# Patient Record
Sex: Male | Born: 1985 | Race: White | Hispanic: No | Marital: Married | Smoking: Former smoker
Health system: Southern US, Community
[De-identification: ages and names within clinical notes are randomized; demographics above are authoritative.]

---
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
## 2013-12-01 ENCOUNTER — Encounter (HOSPITAL_COMMUNITY): Payer: Self-pay | Admitting: Emergency Medicine

## 2013-12-01 ENCOUNTER — Emergency Department (HOSPITAL_COMMUNITY)
Admission: EM | Admit: 2013-12-01 | Discharge: 2013-12-01 | Disposition: A | Discharge status: Home / Self Care | Payer: BC Managed Care – PPO | Attending: Emergency Medicine | Admitting: Emergency Medicine

## 2013-12-01 ENCOUNTER — Emergency Department (HOSPITAL_COMMUNITY): Payer: BC Managed Care – PPO

## 2013-12-01 DIAGNOSIS — R0789 Other chest pain: Secondary | ICD-10-CM | POA: Insufficient documentation

## 2013-12-01 DIAGNOSIS — F17200 Nicotine dependence, unspecified, uncomplicated: Secondary | ICD-10-CM | POA: Insufficient documentation

## 2013-12-01 LAB — CBC WITH DIFFERENTIAL/PLATELET
Basophils Absolute: 0 10*3/uL (ref 0.0–0.1)
Basophils Relative: 0 % (ref 0–1)
Eosinophils Absolute: 0.1 10*3/uL (ref 0.0–0.7)
Eosinophils Relative: 1 % (ref 0–5)
HCT: 44.8 % (ref 39.0–52.0)
Hemoglobin: 15.6 g/dL (ref 13.0–17.0)
LYMPHS ABS: 1.7 10*3/uL (ref 0.7–4.0)
Lymphocytes Relative: 33 % (ref 12–46)
MCH: 29 pg (ref 26.0–34.0)
MCHC: 34.8 g/dL (ref 30.0–36.0)
MCV: 83.3 fL (ref 78.0–100.0)
MONO ABS: 0.5 10*3/uL (ref 0.1–1.0)
MONOS PCT: 9 % (ref 3–12)
NEUTROS PCT: 57 % (ref 43–77)
Neutro Abs: 2.9 10*3/uL (ref 1.7–7.7)
Platelets: 213 10*3/uL (ref 150–400)
RBC: 5.38 MIL/uL (ref 4.22–5.81)
RDW: 12.6 % (ref 11.5–15.5)
WBC: 5.2 10*3/uL (ref 4.0–10.5)

## 2013-12-01 LAB — COMPREHENSIVE METABOLIC PANEL
ALK PHOS: 74 U/L (ref 39–117)
ALT: 20 U/L (ref 0–53)
AST: 18 U/L (ref 0–37)
Albumin: 4.4 g/dL (ref 3.5–5.2)
BILIRUBIN TOTAL: 0.6 mg/dL (ref 0.3–1.2)
BUN: 15 mg/dL (ref 6–23)
CO2: 28 meq/L (ref 19–32)
Calcium: 10 mg/dL (ref 8.4–10.5)
Chloride: 100 mEq/L (ref 96–112)
Creatinine, Ser: 0.99 mg/dL (ref 0.50–1.35)
GFR calc Af Amer: >90 mL/min (ref >90)
GLUCOSE: 103 mg/dL — AB (ref 70–99)
POTASSIUM: 4.5 meq/L (ref 3.7–5.3)
Sodium: 139 mEq/L (ref 137–147)
Total Protein: 7.5 g/dL (ref 6.0–8.3)

## 2013-12-01 LAB — TROPONIN I: Troponin I: <0.3 ng/mL (ref <0.30)

## 2013-12-01 MED ORDER — OMEPRAZOLE 20 MG PO CPDR: 20.0000 mg | DELAYED_RELEASE_CAPSULE | Freq: Every day | ORAL | Status: DC

## 2013-12-01 NOTE — Discharge Instructions (Signed)
Chest Pain (Nonspecific) °There is no evidence of heart attack or blood clot in the lung. Followup with your doctor. Return to the ED if you develop new or worsening symptoms. °It is often hard to give a specific diagnosis for the cause of chest pain. There is always a chance that your pain could be related to something serious, such as a heart attack or a blood clot in the lungs. You need to follow up with your caregiver for further evaluation. °CAUSES  °· Heartburn. °· Pneumonia or bronchitis. °· Anxiety or stress. °· Inflammation around your heart (pericarditis) or lung (pleuritis or pleurisy). °· A blood clot in the lung. °· A collapsed lung (pneumothorax). It can develop suddenly on its own (spontaneous pneumothorax) or from injury (trauma) to the chest. °· Shingles infection (herpes zoster virus). °The chest wall is composed of bones, muscles, and cartilage. Any of these can be the source of the pain. °· The bones can be bruised by injury. °· The muscles or cartilage can be strained by coughing or overwork. °· The cartilage can be affected by inflammation and become sore (costochondritis). °DIAGNOSIS  °Lab tests or other studies, such as X-rays, electrocardiography, stress testing, or cardiac imaging, may be needed to find the cause of your pain.  °TREATMENT  °· Treatment depends on what may be causing your chest pain. Treatment may include: °· Acid blockers for heartburn. °· Anti-inflammatory medicine. °· Pain medicine for inflammatory conditions. °· Antibiotics if an infection is present. °· You may be advised to change lifestyle habits. This includes stopping smoking and avoiding alcohol, caffeine, and chocolate. °· You may be advised to keep your head raised (elevated) when sleeping. This reduces the chance of acid going backward from your stomach into your esophagus. °· Most of the time, nonspecific chest pain will improve within 2 to 3 days with rest and mild pain medicine. °HOME CARE INSTRUCTIONS  °· If  antibiotics were prescribed, take your antibiotics as directed. Finish them even if you start to feel better. °· For the next few days, avoid physical activities that bring on chest pain. Continue physical activities as directed. °· Do not smoke. °· Avoid drinking alcohol. °· Only take over-the-counter or prescription medicine for pain, discomfort, or fever as directed by your caregiver. °· Follow your caregiver's suggestions for further testing if your chest pain does not go away. °· Keep any follow-up appointments you made. If you do not go to an appointment, you could develop lasting (chronic) problems with pain. If there is any problem keeping an appointment, you must call to reschedule. °SEEK MEDICAL CARE IF:  °· You think you are having problems from the medicine you are taking. Read your medicine instructions carefully. °· Your chest pain does not go away, even after treatment. °· You develop a rash with blisters on your chest. °SEEK IMMEDIATE MEDICAL CARE IF:  °· You have increased chest pain or pain that spreads to your arm, neck, jaw, back, or abdomen. °· You develop shortness of breath, an increasing cough, or you are coughing up blood. °· You have severe back or abdominal pain, feel nauseous, or vomit. °· You develop severe weakness, fainting, or chills. °· You have a fever. °THIS IS AN EMERGENCY. Do not wait to see if the pain will go away. Get medical help at once. Call your local emergency services (911 in [place] Do not drive yourself to the hospital. °MAKE SURE YOU:  °· Understand these instructions. °· Will watch your condition. °·   Will get help right away if you are not doing well or get worse. °Document Released: 05/08/2005 Document Revised: 10/21/2011 Document Reviewed: 03/03/2008 °ExitCare® Patient Information ©2014 [employer]. ° °

## 2013-12-01 NOTE — ED Notes (Signed)
Patient with no complaints at this time. Respirations even and unlabored. Skin warm/dry. Discharge instructions reviewed with patient at this time. Patient given opportunity to voice concerns/ask questions. Patient discharged at this time and left Emergency Department with steady gait.   

## 2013-12-01 NOTE — ED Notes (Signed)
Pt reports sharp chest pain in center of chest that radiates into left side of head.  Reports started last night.  Pt says was able to sleep through the night but pain still there when woke up.  Reports has had similar pain in the past but pain usually goes away on its own.  Reports pain started yesterday after playing golf.  Denies movement or deep breaths making it worse.  Denies n/v or SOB.

## 2013-12-01 NOTE — ED Provider Notes (Signed)
CSN: [id number]     Arrival date & time 12/01/13  1558 History  This chart was scribed for Glynn OctaveStephen Toron Bowring, MD by Bennett Scrapehristina Taylor, ED Scribe. This patient was seen in room APA01/AP[facility]01 and the patient's care was started at 4:20 PM.    Chief Complaint  Patient presents with  . Chest Pain    The history is provided by the patient. No language interpreter was used.   HPI Comments: Andrew Carroll is a 28 y.o. male who presents to the Emergency Department complaining of  intermittent chest pain episodes that began yesterday at 5 PM with the longest episode lasting for 10 seconds at a time.  He states that pain raidates to his head but doesn't last more that 2 or 3 seconds. He states that he has similar episodes of this pain and the severity would be about 5 out of 10 resolving after a 30 minute time frame. He states that yesterday the pain jumped to 8 out of 10 and the continuation of the pain today caused him enough concern to seek evaluation. He denies having pain currently. The pt states that nothing makes the pain better or worse. The pt denies associated symptoms of SOB, nausea, back pain, and neck pain. He denies any h/o of cardiac problems or respiratory issues. Pt also smokes cigarettes regularly. Denies any drug use.   [facility] are [facility]CP   History reviewed. No pertinent past medical history. History reviewed. No pertinent past surgical history. No family history on file. History  Substance Use Topics  . Smoking status: Current Every Day Smoker  . Smokeless tobacco: Not on file  . Alcohol Use: Yes     Comment: occ    Review of Systems  A complete 10 system review of systems was obtained and all systems are negative except as noted in the HPI and PMH.    Allergies  Review of patient's allergies indicates no known allergies.  Home Medications   Prior to Admission medications   Not on File   Triage vitals: BP 114/64  Pulse 77  Temp(Src) 97.4 F (36.3 C) (Oral)   Resp 18  Ht 5\' 7"  (1.702 m)  Wt 190 lb (86.183 kg)  BMI 29.75 kg/m2  SpO2 97% Physical Exam  Nursing note and vitals reviewed. Constitutional: He is oriented to person, place, and time. He appears well-developed and well-nourished. No distress.  HENT:  Head: Normocephalic and atraumatic.  Eyes: EOM are normal.  Neck: Normal range of motion. Neck supple. No tracheal deviation present.  Cardiovascular: Normal rate, regular rhythm and normal heart sounds.   Pulmonary/Chest: Effort normal and breath sounds normal. No respiratory distress. He exhibits no tenderness.  Abdominal: Soft. There is no tenderness. There is no rebound and no guarding.  Musculoskeletal: Normal range of motion.  Intact peripheral pulses, no peripheral edema   Neurological: He is alert and oriented to person, place, and time.  CN 2-12 intact, no ataxia on finger to nose, no nystagmus, 5/5 strength throughout, no pronator drift, Romberg negative, normal gait.   Skin: Skin is warm and dry.  Psychiatric: He has a normal mood and affect. His behavior is normal.    ED Course  Procedures (including critical care time)  DIAGNOSTIC STUDIES: Oxygen Saturation is  97% on RA, Adequate by my interpretation.    COORDINATION OF CARE: 4:49 PM-Discussed treatment plan which includes  CBC panel, CMP,with pt at bedside and pt agreed to plan.   5:17 PM-Pt rechecked and reports having  one episode of pain while ambulating to radiology. Denies pain now. Informed pt of radiology and lab work results that are NOT concerning for cardiac conditions. Discussed discharge plan which includes prilosec with pt and pt agreed to plan. Also advised pt to follow up as needed and pt agreed. Addressed symptoms to return for with pt.     Labs Review Labs Reviewed  COMPREHENSIVE METABOLIC PANEL - Abnormal; Notable for the following:    Glucose, Bld 103 (*)    All other components within normal limits  CBC WITH DIFFERENTIAL  TROPONIN I     Imaging Review Dg Chest 2 View  12/01/2013   CLINICAL DATA:  Left chest pain  EXAM: CHEST  2 VIEW  COMPARISON:  None.  FINDINGS: The heart size and mediastinal contours are within normal limits. Both lungs are clear. The visualized skeletal structures are unremarkable.  IMPRESSION: No active cardiopulmonary disease.   Electronically Signed   By: Ruel Favorsrevor  Shick M.D.   On: 12/01/2013 16:48     EKG Interpretation   Date/Time:  Wednesday December 01 2013 16:20:35 EDT Ventricular Rate:  85 PR Interval:  128 QRS Duration: 82 QT Interval:  350 QTC Calculation: 416 R Axis:   69 Text Interpretation:  Normal sinus rhythm Normal ECG No previous ECGs  available No previous ECGs available Confirmed by Jaqwon Manfred  MD, Shuree Brossart  [phone]([id number]) on 12/01/2013 4:49:48 PM      MDM   Final diagnoses:  Atypical chest pain   3 day History of intermittent episodes of substernal chest pain lasting 10 seconds at a time. No associated symptoms. No radiation.  EKG normal sinus rhythm without ST changes. Pain is atypical for ACS or PE.  Labs unremarkable. Troponin negative. Chest x-ray negative. No chest pain in the ED. HEART score is zero. Pain is atypical for ACS or PE. Patient will be discharged. Encouraged to start PPI, followup with PCP.  I personally performed the services described in this documentation, which was scribed in my presence. The recorded information has been reviewed and is accurate.    Glynn OctaveStephen Sharrieff Spratlin, MD 12/01/13 1807

## 2014-01-13 ENCOUNTER — Other Ambulatory Visit (HOSPITAL_COMMUNITY): Payer: Self-pay | Admitting: Physician Assistant

## 2014-01-13 ENCOUNTER — Ambulatory Visit (HOSPITAL_COMMUNITY)
Admission: RE | Admit: 2014-01-13 | Discharge: 2014-01-13 | Disposition: A | Discharge status: Home / Self Care | Payer: BC Managed Care – PPO | Source: Ambulatory Visit | Attending: Physician Assistant | Admitting: Physician Assistant

## 2014-01-13 ENCOUNTER — Encounter (INDEPENDENT_AMBULATORY_CARE_PROVIDER_SITE_OTHER): Payer: Self-pay

## 2014-01-13 DIAGNOSIS — M79609 Pain in unspecified limb: Secondary | ICD-10-CM

## 2014-09-22 ENCOUNTER — Other Ambulatory Visit (HOSPITAL_COMMUNITY): Payer: Self-pay | Admitting: Family Medicine

## 2014-09-22 DIAGNOSIS — R1011 Right upper quadrant pain: Secondary | ICD-10-CM

## 2014-09-26 ENCOUNTER — Ambulatory Visit (HOSPITAL_COMMUNITY)
Admission: RE | Admit: 2014-09-26 | Discharge: 2014-09-26 | Disposition: A | Discharge status: Home / Self Care | Payer: 59 | Source: Ambulatory Visit | Attending: Family Medicine | Admitting: Family Medicine

## 2014-09-26 DIAGNOSIS — R1011 Right upper quadrant pain: Secondary | ICD-10-CM | POA: Diagnosis present

## 2014-09-28 ENCOUNTER — Other Ambulatory Visit (HOSPITAL_COMMUNITY): Payer: Self-pay | Admitting: Family Medicine

## 2014-09-28 DIAGNOSIS — R109 Unspecified abdominal pain: Secondary | ICD-10-CM

## 2014-09-30 ENCOUNTER — Encounter (HOSPITAL_COMMUNITY): Payer: Self-pay

## 2014-09-30 ENCOUNTER — Encounter (HOSPITAL_COMMUNITY)
Admission: RE | Admit: 2014-09-30 | Discharge: 2014-09-30 | Disposition: A | Discharge status: Home / Self Care | Payer: 59 | Source: Ambulatory Visit | Attending: Family Medicine | Admitting: Family Medicine

## 2014-09-30 DIAGNOSIS — R109 Unspecified abdominal pain: Secondary | ICD-10-CM | POA: Insufficient documentation

## 2014-09-30 MED ORDER — STERILE WATER FOR INJECTION IJ SOLN
INTRAMUSCULAR | Status: AC
  Filled 2014-09-30: qty 10

## 2014-09-30 MED ORDER — TECHNETIUM TC 99M MEBROFENIN IV KIT
5.0000 | PACK | Freq: Once | INTRAVENOUS | Status: AC | PRN
  Administered 2014-09-30: 5 via INTRAVENOUS

## 2014-09-30 MED ORDER — STERILE WATER FOR INJECTION IJ SOLN
INTRAMUSCULAR | Status: AC
  Administered 2014-09-30: 5 mL via INTRAVENOUS
  Filled 2014-09-30: qty 10

## 2014-09-30 MED ORDER — SINCALIDE 5 MCG IJ SOLR
INTRAMUSCULAR | Status: AC
  Administered 2014-09-30: 1.73 ug via INTRAVENOUS
  Filled 2014-09-30: qty 5

## 2014-09-30 MED ORDER — SODIUM CHLORIDE 0.9 % IJ SOLN
INTRAMUSCULAR | Status: AC
Start: 2014-09-30 — End: 2014-09-30
  Filled 2014-09-30: qty 42

## 2015-09-22 ENCOUNTER — Ambulatory Visit (HOSPITAL_COMMUNITY)
Admission: RE | Admit: 2015-09-22 | Discharge: 2015-09-22 | Disposition: A | Discharge status: Home / Self Care | Payer: 59 | Source: Ambulatory Visit | Attending: Physician Assistant | Admitting: Physician Assistant

## 2015-09-22 ENCOUNTER — Other Ambulatory Visit (HOSPITAL_COMMUNITY): Payer: Self-pay | Admitting: Physician Assistant

## 2015-09-22 DIAGNOSIS — M79644 Pain in right finger(s): Secondary | ICD-10-CM | POA: Insufficient documentation

## 2015-09-22 DIAGNOSIS — Z1389 Encounter for screening for other disorder: Secondary | ICD-10-CM | POA: Insufficient documentation

## 2015-09-22 DIAGNOSIS — Z6830 Body mass index (BMI) 30.0-30.9, adult: Secondary | ICD-10-CM | POA: Diagnosis not present

## 2015-09-22 DIAGNOSIS — E6609 Other obesity due to excess calories: Secondary | ICD-10-CM | POA: Diagnosis not present

## 2016-09-01 IMAGING — NM NM HEPATO W/GB/PHARM/[PERSON_NAME]
2 series · 12 of 12 positions shown · non-contrast
Comparison: None.

CLINICAL DATA: Right upper quadrant pain.

EXAM:
NUCLEAR MEDICINE HEPATOBILIARY IMAGING WITH GALLBLADDER EF
TECHNIQUE: Sequential images of the abdomen were obtained [DATE] minutes
following intravenous administration of radiopharmaceutical. After
slow intravenous infusion of 1.73 micrograms Cholecystokinin,
gallbladder ejection fraction was determined.
RADIOPHARMACEUTICALS:  5.0 Millicurie Ic-FFm Choletec

[Series 1: biliary · 3.25mm/px · 6 of 60 frames shown]
[frame 6/60]
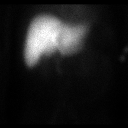
[frame 16/60]
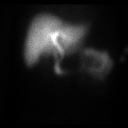
[frame 26/60]
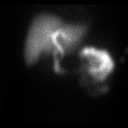
[frame 36/60]
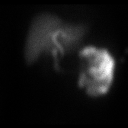
[frame 46/60]
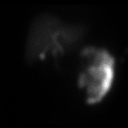
[frame 56/60]
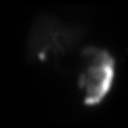

[Series 2: gbef · 3.25mm/px · 6 of 45 frames shown]
[frame 4/45]
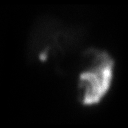
[frame 12/45]
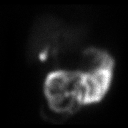
[frame 19/45]
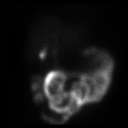
[frame 27/45]
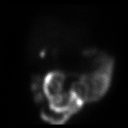
[frame 34/45]
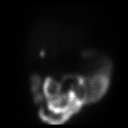
[frame 42/45]
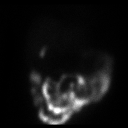

[12 of 12 positions shown; findings below may reference images not displayed]

FINDINGS: Liver, biliary system, gallbladder, bowel visualize normal.
Gallbladder ejection fraction of 86% at 45 minutes noted.. At 45
min, normal ejection fraction is greater than 40%.
IMPRESSION: Normal exam.

## 2017-05-22 DIAGNOSIS — M546 Pain in thoracic spine: Secondary | ICD-10-CM | POA: Diagnosis not present

## 2018-03-21 ENCOUNTER — Other Ambulatory Visit: Payer: Self-pay

## 2018-03-21 ENCOUNTER — Ambulatory Visit (HOSPITAL_COMMUNITY)
Admission: EM | Admit: 2018-03-21 | Discharge: 2018-03-21 | Disposition: A | Discharge status: Home / Self Care | Payer: 59 | Attending: Internal Medicine | Admitting: Internal Medicine

## 2018-03-21 ENCOUNTER — Encounter (HOSPITAL_COMMUNITY): Payer: Self-pay | Admitting: *Deleted

## 2018-03-21 DIAGNOSIS — J029 Acute pharyngitis, unspecified: Secondary | ICD-10-CM | POA: Diagnosis not present

## 2018-03-21 DIAGNOSIS — Z87891 Personal history of nicotine dependence: Secondary | ICD-10-CM | POA: Diagnosis not present

## 2018-03-21 DIAGNOSIS — Z79899 Other long term (current) drug therapy: Secondary | ICD-10-CM | POA: Insufficient documentation

## 2018-03-21 LAB — POCT RAPID STREP A: STREPTOCOCCUS, GROUP A SCREEN (DIRECT): NEGATIVE

## 2018-03-21 MED ORDER — CETIRIZINE HCL 10 MG PO CAPS: 10.0000 mg | ORAL_CAPSULE | Freq: Every day | ORAL | 0 refills | Status: DC

## 2018-03-21 MED ORDER — AMOXICILLIN-POT CLAVULANATE 875-125 MG PO TABS: 1.0000 | ORAL_TABLET | Freq: Two times a day (BID) | ORAL | 0 refills | Status: DC

## 2018-03-21 MED ORDER — FLUTICASONE PROPIONATE 50 MCG/ACT NA SUSP: 1.0000 | Freq: Every day | NASAL | 2 refills | Status: DC

## 2018-03-21 NOTE — Discharge Instructions (Signed)

## 2018-03-21 NOTE — ED Provider Notes (Signed)
[facility]    CSN: [id number] Arrival date & time: 03/21/18  1009     History   Chief Complaint Chief Complaint  Patient presents with  . Sore Throat    HPI Andrew Carroll is a 32 y.o. male no significant PMH, Patient is presenting with URI symptoms- congestion, cough, sore throat. Patient's main complaints are sore throat. Symptoms have been going on for 1 week. Patient has tried NyQuil, Tylenol cold and headache., with minimal relief. Denies fever, nausea, vomiting, diarrhea. Denies shortness of breath and chest pain.   HPI  History reviewed. No pertinent past medical history.  There are no active problems to display for this patient.   History reviewed. No pertinent surgical history.     Home Medications    Prior to Admission medications   Medication Sig Start Date End Date Taking? Authorizing Provider  amoxicillin-clavulanate (AUGMENTIN) 875-125 MG tablet Take 1 tablet by mouth every 12 (twelve) hours. 03/21/18   Cyann Venti C, PA-C  Cetirizine HCl 10 MG CAPS Take 1 capsule (10 mg total) by mouth daily for 10 days. 03/21/18 03/31/18  Arayna Illescas C, PA-C  fluticasone (FLONASE) 50 MCG/ACT nasal spray Place 1-2 sprays into both nostrils daily. 03/21/18   Diora Bellizzi, Junius Creamer, PA-C    Family History Family History  Problem Relation Age of Onset  . Healthy Mother   . Healthy Father     Social History Social History   Tobacco Use  . Smoking status: Former Games developer  . Smokeless tobacco: Never Used  Substance Use Topics  . Alcohol use: Not Currently  . Drug use: No     Allergies   Patient has no known allergies.   Review of Systems Review of Systems  Constitutional: Negative for activity change, appetite change, chills, fatigue and fever.  HENT: Positive for congestion, rhinorrhea and sore throat. Negative for ear pain, sinus pressure and trouble swallowing.   Eyes: Negative for discharge and redness.  Respiratory: Positive for cough.  Negative for chest tightness and shortness of breath.   Cardiovascular: Negative for chest pain.  Gastrointestinal: Negative for abdominal pain, diarrhea, nausea and vomiting.  Musculoskeletal: Negative for myalgias.  Skin: Negative for rash.  Neurological: Negative for dizziness, light-headedness and headaches.     Physical Exam Triage Vital Signs ED Triage Vitals  Enc Vitals Group     BP 03/21/18 1048 116/79     Pulse Rate 03/21/18 1048 88     Resp 03/21/18 1048 16     Temp 03/21/18 1048 98.5 F (36.9 C)     Temp Source 03/21/18 1048 Oral     SpO2 03/21/18 1048 96 %     Weight --      Height --      Head Circumference --      Peak Flow --      Pain Score 03/21/18 1049 7     Pain Loc --      Pain Edu? --      Excl. in GC? --    No data found.  Updated Vital Signs BP 116/79   Pulse 88   Temp 98.5 F (36.9 C) (Oral)   Resp 16   SpO2 96%   Visual Acuity Right Eye Distance:   Left Eye Distance:   Bilateral Distance:    Right Eye Near:   Left Eye Near:    Bilateral Near:     Physical Exam  Constitutional: He appears well-developed and well-nourished.  HENT:  Head:  Normocephalic and atraumatic.  Bilateral ears without tenderness to palpation of external auricle, tragus and mastoid, EAC's without erythema or swelling, TM's with good bony landmarks and cone of light. Non erythematous.  Oral mucosa pink and moist, no tonsillar enlargement or exudate. Posterior pharynx patent and erythematous, no uvula deviation or swelling. Normal phonation.   Eyes: Conjunctivae are normal.  Neck: Neck supple.  Cardiovascular: Normal rate and regular rhythm.  No murmur heard. Pulmonary/Chest: Effort normal and breath sounds normal. No respiratory distress.  Breathing comfortably at rest, CTABL, no wheezing, rales or other adventitious sounds auscultated  Abdominal: Soft. There is no tenderness.  Musculoskeletal: He exhibits no edema.  Neurological: He is alert.  Skin: Skin is  warm and dry.  Psychiatric: He has a normal mood and affect.  Nursing note and vitals reviewed.    UC Treatments / Results  Labs (all labs ordered are listed, but only abnormal results are displayed) Labs Reviewed  CULTURE, GROUP A STREP [facility]  POCT RAPID STREP A    EKG None  Radiology No results found.  Procedures Procedures (including critical care time)  Medications Ordered in UC Medications - No data to display  Initial Impression / Assessment and Plan / UC Course  I have reviewed the triage vital signs and the nursing notes.  Pertinent labs & imaging results that were available during my care of the patient were reviewed by me and considered in my medical decision making (see chart for details).     Patient with URI symptoms, strep test negative.  Likely viral etiology.  Will recommend to continue symptomatic management.  Will add in daily allergy pill to help with congestion and postnasal drainage contributing to sore throat.  Flonase nasal spray for further management of congestion.  Also discussed using OTC oral decongestant like Mucinex or Sudafed.  Vital signs stable.  Provided printed prescription for Augmentin to treat for sinusitis if symptoms not improving in 3 to 4 days with addition of recommendations above.Discussed strict return precautions. Patient verbalized understanding and is agreeable with plan.  Final Clinical Impressions(s) / UC Diagnoses   Final diagnoses:  Sore throat     Discharge Instructions     Sore Throat  Your rapid strep tested Negative today. We will send for a culture and call in about 2 days if results are positive. For now we will treat your sore throat as a virus with symptom management.   Please continue Tylenol or Ibuprofen for fever and pain. May try salt water gargles, cepacol lozenges, throat spray, or OTC cold relief medicine for throat discomfort. If you also have congestion take a daily anti-histamine like Zyrtec,  Claritin, and a oral decongestant to help with post nasal drip that may be irritating your throat.   Stay hydrated and drink plenty of fluids to keep your throat coated relieve irritation.    ED Prescriptions    Medication Sig Dispense Auth. Provider   amoxicillin-clavulanate (AUGMENTIN) 875-125 MG tablet Take 1 tablet by mouth every 12 (twelve) hours. 14 tablet Dvontae Ruan C, PA-C   Cetirizine HCl 10 MG CAPS Take 1 capsule (10 mg total) by mouth daily for 10 days. 10 capsule Tekoa Hamor C, PA-C   fluticasone (FLONASE) 50 MCG/ACT nasal spray Place 1-2 sprays into both nostrils daily. 16 g Tamu Golz, RipplemeadHallie C, PA-C     Controlled Substance Prescriptions  Controlled Substance Registry consulted? Not Applicable   Lew DawesWieters, Krista Godsil C, [place]PA-C 03/21/18 1308

## 2018-03-21 NOTE — ED Triage Notes (Signed)
C/O sore throat with some nasal congestion x approx 1 wk without fever.  States woke during night with sore throat severe.

## 2018-03-24 LAB — CULTURE, GROUP A STREP (THRC)

## 2018-05-29 DIAGNOSIS — J209 Acute bronchitis, unspecified: Secondary | ICD-10-CM | POA: Diagnosis not present

## 2018-06-23 DIAGNOSIS — E6609 Other obesity due to excess calories: Secondary | ICD-10-CM | POA: Diagnosis not present

## 2018-06-23 DIAGNOSIS — Z0000 Encounter for general adult medical examination without abnormal findings: Secondary | ICD-10-CM | POA: Diagnosis not present

## 2018-06-23 DIAGNOSIS — Z6831 Body mass index (BMI) 31.0-31.9, adult: Secondary | ICD-10-CM | POA: Diagnosis not present

## 2020-03-07 ENCOUNTER — Other Ambulatory Visit: Payer: Self-pay

## 2020-03-07 ENCOUNTER — Ambulatory Visit
Admission: RE | Admit: 2020-03-07 | Discharge: 2020-03-07 | Disposition: A | Discharge status: Home / Self Care | Payer: BC Managed Care – PPO | Source: Ambulatory Visit | Attending: Physician Assistant | Admitting: Physician Assistant

## 2020-03-07 VITALS — BP 112/72 | Temp 98.6°F | Resp 18 | Ht 67.0 in | Wt 215.0 lb

## 2020-03-07 DIAGNOSIS — R21 Rash and other nonspecific skin eruption: Secondary | ICD-10-CM

## 2020-03-07 NOTE — ED Triage Notes (Signed)
Pt had hand foot and mouth disease he caught from his son at daycare x5 days ago.  Pt needs not to return to work.

## 2020-03-07 NOTE — Discharge Instructions (Addendum)
Safe to return to work.  °

## 2021-03-18 ENCOUNTER — Encounter: Payer: Self-pay | Admitting: Emergency Medicine

## 2021-03-18 ENCOUNTER — Ambulatory Visit
Admission: EM | Admit: 2021-03-18 | Discharge: 2021-03-18 | Disposition: A | Discharge status: Home / Self Care | Payer: BC Managed Care – PPO | Attending: Family Medicine | Admitting: Family Medicine

## 2021-03-18 ENCOUNTER — Other Ambulatory Visit: Payer: Self-pay

## 2021-03-18 DIAGNOSIS — H66003 Acute suppurative otitis media without spontaneous rupture of ear drum, bilateral: Secondary | ICD-10-CM | POA: Diagnosis not present

## 2021-03-18 MED ORDER — PREDNISONE 20 MG PO TABS: 20.0000 mg | ORAL_TABLET | Freq: Every day | ORAL | 0 refills | Status: AC

## 2021-03-18 MED ORDER — AMOXICILLIN 875 MG PO TABS: 875.0000 mg | ORAL_TABLET | Freq: Two times a day (BID) | ORAL | 0 refills | Status: DC

## 2021-03-18 NOTE — ED Provider Notes (Signed)
[facility]    CSN: [id number] Arrival date & time: 03/18/21  0815      History   Chief Complaint No chief complaint on file.   HPI Andrew Carroll is a 35 y.o. male.   HPI  Bilateral ear pain x2 days.  Patient reports he was swimming a couple days ago and had mild discomfort following activity.  Over the last 2 days the pain has worsened.  The left ear is more intensely painful compared to the right.  Pain with chewing and with pressing behind the left ear.  Denies any drainage.  Denies any history of recurrent ear infections.  Denies any associated URI changes to hearing.  History reviewed. No pertinent past medical history.  There are no problems to display for this patient.   History reviewed. No pertinent surgical history.     Home Medications    Prior to Admission medications   Medication Sig Start Date End Date Taking? Authorizing Provider  amoxicillin (AMOXIL) 875 MG tablet Take 1 tablet (875 mg total) by mouth 2 (two) times daily. 03/18/21  Yes Bing Neighbors, FNP  predniSONE (DELTASONE) 20 MG tablet Take 1 tablet (20 mg total) by mouth daily with breakfast for 5 days. 03/18/21 03/23/21 Yes Bing Neighbors, FNP  amoxicillin-clavulanate (AUGMENTIN) 875-125 MG tablet Take 1 tablet by mouth every 12 (twelve) hours. 03/21/18   Wieters, Hallie C, PA-C  Cetirizine HCl 10 MG CAPS Take 1 capsule (10 mg total) by mouth daily for 10 days. 03/21/18 03/31/18  Wieters, Hallie C, PA-C  fluticasone (FLONASE) 50 MCG/ACT nasal spray Place 1-2 sprays into both nostrils daily. 03/21/18   Wieters, Junius Creamer, PA-C    Family History Family History  Problem Relation Age of Onset   Healthy Mother    Healthy Father     Social History Social History   Tobacco Use   Smoking status: Former   Smokeless tobacco: Never  Building services engineer Use: Never used  Substance Use Topics   Alcohol use: Not Currently   Drug use: No     Allergies   Patient has no known  allergies.   Review of Systems Review of Systems Pertinent negatives listed in HPI  Physical Exam Triage Vital Signs ED Triage Vitals  Enc Vitals Group     BP 03/18/21 0822 109/73     Pulse Rate 03/18/21 0822 80     Resp 03/18/21 0822 16     Temp 03/18/21 0822 98.6 F (37 C)     Temp Source 03/18/21 0822 Oral     SpO2 03/18/21 0822 97 %     Weight --      Height --      Head Circumference --      Peak Flow --      Pain Score 03/18/21 0821 8     Pain Loc --      Pain Edu? --      Excl. in GC? --    No data found.  Updated Vital Signs BP 109/73 (BP Location: Right Arm)   Pulse 80   Temp 98.6 F (37 C) (Oral)   Resp 16   SpO2 97%   Visual Acuity Right Eye Distance:   Left Eye Distance:   Bilateral Distance:    Right Eye Near:   Left Eye Near:    Bilateral Near:     Physical Exam Constitutional:      Appearance: Normal appearance.  HENT:  Right Ear: Hearing normal. Swelling and tenderness present. Tympanic membrane is erythematous.     Left Ear: Hearing normal. Swelling and tenderness present. A middle ear effusion is present. Tympanic membrane is erythematous and bulging.  Cardiovascular:     Rate and Rhythm: Normal rate and regular rhythm.  Pulmonary:     Effort: Pulmonary effort is normal.     Breath sounds: Normal breath sounds.  Skin:    Capillary Refill: Capillary refill takes less than 2 seconds.  Neurological:     General: No focal deficit present.     Mental Status: He is alert and oriented to person, place, and time. Mental status is at baseline.  Psychiatric:        Mood and Affect: Mood normal.        Behavior: Behavior normal.        Thought Content: Thought content normal.        Judgment: Judgment normal.     UC Treatments / Results  Labs (all labs ordered are listed, but only abnormal results are displayed) Labs Reviewed - No data to display  EKG   Radiology No results found.  Procedures Procedures (including critical  care time)  Medications Ordered in UC Medications - No data to display  Initial Impression / Assessment and Plan / UC Course  I have reviewed the triage vital signs and the nursing notes.  Pertinent labs & imaging results that were available during my care of the patient were reviewed by me and considered in my medical decision making (see chart for details).    Bilateral otitis media.  Treatment per discharge instructions.  Red flag precautions given.  Return as needed Final Clinical Impressions(s) / UC Diagnoses   Final diagnoses:  Non-recurrent acute suppurative otitis media of both ears without spontaneous rupture of tympanic membranes   Discharge Instructions   None    ED Prescriptions     Medication Sig Dispense Auth. Provider   amoxicillin (AMOXIL) 875 MG tablet Take 1 tablet (875 mg total) by mouth 2 (two) times daily. 20 tablet Bing Neighbors, FNP   predniSONE (DELTASONE) 20 MG tablet Take 1 tablet (20 mg total) by mouth daily with breakfast for 5 days. 5 tablet Bing Neighbors, FNP      PDMP not reviewed this encounter.   Bing Neighbors, [place] 03/18/21 [phone]

## 2021-03-18 NOTE — ED Triage Notes (Signed)
Bilateral ear pain.  Left ear feels worse.  Pain since yesterday.

## 2021-03-27 ENCOUNTER — Other Ambulatory Visit: Payer: Self-pay

## 2021-03-27 ENCOUNTER — Ambulatory Visit
Admission: RE | Admit: 2021-03-27 | Discharge: 2021-03-27 | Disposition: A | Discharge status: Home / Self Care | Payer: BC Managed Care – PPO | Source: Ambulatory Visit | Attending: Family Medicine | Admitting: Family Medicine

## 2021-03-27 VITALS — BP 116/77 | HR 88 | Temp 98.4°F | Resp 16

## 2021-03-27 DIAGNOSIS — H66012 Acute suppurative otitis media with spontaneous rupture of ear drum, left ear: Secondary | ICD-10-CM | POA: Diagnosis not present

## 2021-03-27 MED ORDER — TIZANIDINE HCL 4 MG PO TABS: 4.0000 mg | ORAL_TABLET | Freq: Every evening | ORAL | 0 refills | Status: DC | PRN

## 2021-03-27 MED ORDER — INDOMETHACIN 50 MG PO CAPS: 50.0000 mg | ORAL_CAPSULE | Freq: Two times a day (BID) | ORAL | 0 refills | Status: DC | PRN

## 2021-03-27 MED ORDER — CEFDINIR 300 MG PO CAPS: 300.0000 mg | ORAL_CAPSULE | Freq: Two times a day (BID) | ORAL | 0 refills | Status: AC

## 2021-03-27 NOTE — ED Provider Notes (Addendum)
[facility]    CSN: [id number] Arrival date & time: 03/27/21  1740      History   Chief Complaint No chief complaint on file.   HPI Andrew Carroll is a 35 y.o. male.   HPI Patient seen on 03/18/21 diagnosed with left ear infection and treated with a course of antibiotics. Symptoms persisted despite treatment. He has worsening left ear pain and Is unable to hear clearly from the left ear. Endorses drainage from left ear. Endorses recurrent back pain. Pain radiates down left leg.  No known injury. He has not taken any medication for pain. History reviewed. No pertinent past medical history.  There are no problems to display for this patient.   History reviewed. No pertinent surgical history.     Home Medications    Prior to Admission medications   Medication Sig Start Date End Date Taking? Authorizing Provider  cefdinir (OMNICEF) 300 MG capsule Take 1 capsule (300 mg total) by mouth 2 (two) times daily for 7 days. 03/27/21 04/03/21 Yes Bing Neighbors, FNP  indomethacin (INDOCIN) 50 MG capsule Take 1 capsule (50 mg total) by mouth 2 (two) times daily as needed. 03/27/21  Yes Bing Neighbors, FNP  tiZANidine (ZANAFLEX) 4 MG tablet Take 1 tablet (4 mg total) by mouth at bedtime as needed for muscle spasms. 03/27/21  Yes Bing Neighbors, FNP  Cetirizine HCl 10 MG CAPS Take 1 capsule (10 mg total) by mouth daily for 10 days. 03/21/18 03/31/18  Wieters, Hallie C, PA-C  fluticasone (FLONASE) 50 MCG/ACT nasal spray Place 1-2 sprays into both nostrils daily. 03/21/18   Wieters, Hallie C, PA-C  trimethoprim-polymyxin b (POLYTRIM) ophthalmic solution Place 1 drop into the left eye every 4 (four) hours. 03/29/21   Margaretann Loveless, PA-C    Family History Family History  Problem Relation Age of Onset   Healthy Mother    Healthy Father     Social History Social History   Tobacco Use   Smoking status: Former   Smokeless tobacco: Never  Building services engineer Use:  Never used  Substance Use Topics   Alcohol use: Not Currently   Drug use: No     Allergies   Patient has no known allergies.   Review of Systems Review of Systems Pertinent negatives listed in HPI   Physical Exam Triage Vital Signs ED Triage Vitals  Enc Vitals Group     BP 03/27/21 1841 116/77     Pulse Rate 03/27/21 1841 88     Resp 03/27/21 1841 16     Temp 03/27/21 1841 98.4 F (36.9 C)     Temp Source 03/27/21 1841 Tympanic     SpO2 03/27/21 1841 98 %     Weight --      Height --      Head Circumference --      Peak Flow --      Pain Score 03/27/21 1845 5     Pain Loc --      Pain Edu? --      Excl. in [place]? --    No data found.  Updated Vital Signs BP 116/77 (BP Location: Right Arm)   Pulse 88   Temp 98.4 F (36.9 C) (Tympanic)   Resp 16   SpO2 98%   Visual Acuity Right Eye Distance:   Left Eye Distance:   Bilateral Distance:    Right Eye Near:   Left Eye Near:    Bilateral  Near:     Physical Exam  General Appearance:    Alert, cooperative, no distress  HENT:   left TM red, dull, bulging and left TM fluid noted  Eyes:    PERRL, conjunctiva/corneas clear, EOM's intact       Lungs:     Clear to auscultation bilaterally, respirations unlabored  Heart:    Regular rate and rhythm  Neurologic:   Awake, alert, oriented x 3. No apparent focal neurological           defect.        UC Treatments / Results  Labs (all labs ordered are listed, but only abnormal results are displayed) Labs Reviewed - No data to display  EKG   Radiology No results found.  Procedures Procedures (including critical care time)  Medications Ordered in UC Medications - No data to display  Initial Impression / Assessment and Plan / UC Course  I have reviewed the triage vital signs and the nursing notes.  Pertinent labs & imaging results that were available during my care of the patient were reviewed by me and considered in my medical decision making (see chart for  details).    Unresolved acute left otitis media Treatment per discharge medication orders. Tylenol and ibuprofen for pain.  Follow-up with ENT if no improvement Final Clinical Impressions(s) / UC Diagnoses   Final diagnoses:  Acute suppurative otitis media of left ear with spontaneous rupture of tympanic membrane, recurrence not specified   Discharge Instructions   None    ED Prescriptions     Medication Sig Dispense Auth. Provider   cefdinir (OMNICEF) 300 MG capsule Take 1 capsule (300 mg total) by mouth 2 (two) times daily for 7 days. 14 capsule Bing Neighbors, FNP   indomethacin (INDOCIN) 50 MG capsule Take 1 capsule (50 mg total) by mouth 2 (two) times daily as needed. 30 capsule Bing Neighbors, FNP   tiZANidine (ZANAFLEX) 4 MG tablet Take 1 tablet (4 mg total) by mouth at bedtime as needed for muscle spasms. 30 tablet Bing Neighbors, FNP      PDMP not reviewed this encounter.   Bing Neighbors, FNP 04/03/21 2259    Bing Neighbors, FNP 04/03/21 [phone]

## 2021-03-27 NOTE — ED Triage Notes (Signed)
Was seen for ear pain on 8/7 and diagnosed with ear infection.  States ear continues to hurt and drain.    Also c/o lower left back pain.  Hurts worse when standing and pain radiates down left leg.

## 2021-03-29 ENCOUNTER — Telehealth: Payer: BC Managed Care – PPO | Admitting: Physician Assistant

## 2021-03-29 DIAGNOSIS — H109 Unspecified conjunctivitis: Secondary | ICD-10-CM

## 2021-03-29 MED ORDER — POLYMYXIN B-TRIMETHOPRIM 10000-0.1 UNIT/ML-% OP SOLN: 1.0000 [drp] | OPHTHALMIC | 0 refills | Status: DC

## 2021-03-29 NOTE — Progress Notes (Signed)

## 2021-07-31 ENCOUNTER — Other Ambulatory Visit: Payer: Self-pay

## 2021-07-31 ENCOUNTER — Ambulatory Visit
Admission: RE | Admit: 2021-07-31 | Discharge: 2021-07-31 | Disposition: A | Discharge status: Home / Self Care | Payer: BC Managed Care – PPO | Source: Ambulatory Visit | Attending: Student | Admitting: Student

## 2021-07-31 VITALS — BP 133/73 | HR 105 | Temp 99.0°F | Resp 18

## 2021-07-31 DIAGNOSIS — J111 Influenza due to unidentified influenza virus with other respiratory manifestations: Secondary | ICD-10-CM

## 2021-07-31 DIAGNOSIS — Z20828 Contact with and (suspected) exposure to other viral communicable diseases: Secondary | ICD-10-CM

## 2021-07-31 MED ORDER — ONDANSETRON 8 MG PO TBDP: 8.0000 mg | ORAL_TABLET | Freq: Three times a day (TID) | ORAL | 0 refills | Status: DC | PRN

## 2021-07-31 MED ORDER — OSELTAMIVIR PHOSPHATE 75 MG PO CAPS: 75.0000 mg | ORAL_CAPSULE | Freq: Two times a day (BID) | ORAL | 0 refills | Status: DC

## 2021-07-31 NOTE — Discharge Instructions (Addendum)
-  Tamiflu twice daily x5 days. This medication can cause nausea, so I also sent nausea medication. You can stop the Tamiflu if you don't like it or if it causes side effects.  -Take the Zofran (ondansetron) up to 3 times daily for nausea and vomiting. -For fevers/chills, bodyaches, headaches- You can take Tylenol up to 1000 mg 3 times daily, and ibuprofen up to 600 mg 3 times daily with food.  You can take these together, or alternate every 3-4 hours. -Drink plenty of water/gatorade and get plenty of rest -With a virus, you're typically contagious for 5-7 days, or as long as you're having fevers.  -Come back and see us if things are getting worse instead of better, like shortness of breath, chest pain, fevers and chills that are getting higher instead of lower and do not come down with Tylenol or ibuprofen, etc.  

## 2021-07-31 NOTE — ED Provider Notes (Signed)
[facility]    CSN: [id number] Arrival date & time: 07/31/21  [id number]      History   Chief Complaint No chief complaint on file.   HPI Andrew Carroll is a 35 y.o. male presenting with viral syndrome for 1 day.  Medical history noncontributory, denies history of cardiopulmonary disease.  Describes fevers of 99 at home, body aches, sore throat, nonproductive cough, fatigue, nasal congestion, subjective chills. Has taken tylenol for relief. Denies SOB, CP, dizziness, weakness.   HPI  History reviewed. No pertinent past medical history.  There are no problems to display for this patient.   History reviewed. No pertinent surgical history.     Home Medications    Prior to Admission medications   Medication Sig Start Date End Date Taking? Authorizing Provider  ondansetron (ZOFRAN-ODT) 8 MG disintegrating tablet Take 1 tablet (8 mg total) by mouth every 8 (eight) hours as needed for nausea or vomiting. 07/31/21  Yes Hazel Sams, PA-Carroll  oseltamivir (TAMIFLU) 75 MG capsule Take 1 capsule (75 mg total) by mouth every 12 (twelve) hours. 07/31/21  Yes Hazel Sams, PA-Carroll  Cetirizine HCl 10 MG CAPS Take 1 capsule (10 mg total) by mouth daily for 10 days. 03/21/18 03/31/18  Wieters, Hallie C, PA-Carroll  fluticasone (FLONASE) 50 MCG/ACT nasal spray Place 1-2 sprays into both nostrils daily. 03/21/18   Wieters, Elesa Hacker, PA-[place]    Family History Family History  Problem Relation Age of Onset   Healthy Mother    Healthy Father     Social History Social History   Tobacco Use   Smoking status: Former   Smokeless tobacco: Never  Scientific laboratory technician Use: Never used  Substance Use Topics   Alcohol use: Not Currently   Drug use: No     Allergies   Patient has no known allergies.   Review of Systems Review of Systems  Constitutional:  Positive for chills, fatigue and fever. Negative for appetite change.  HENT:  Positive for congestion and sore throat. Negative for ear  pain, rhinorrhea, sinus pressure and sinus pain.   Eyes:  Negative for redness and visual disturbance.  Respiratory:  Positive for cough. Negative for chest tightness, shortness of breath and wheezing.   Cardiovascular:  Negative for chest pain and palpitations.  Gastrointestinal:  Negative for abdominal pain, constipation, diarrhea, nausea and vomiting.  Genitourinary:  Negative for dysuria, frequency and urgency.  Musculoskeletal:  Positive for myalgias.  Neurological:  Negative for dizziness, weakness and headaches.  Psychiatric/Behavioral:  Negative for confusion.   All other systems reviewed and are negative.   Physical Exam Triage Vital Signs ED Triage Vitals [07/31/21 1011]  Enc Vitals Group     BP 133/73     Pulse Rate (!) 105     Resp 18     Temp 99 F (37.2 Carroll)     Temp Source Oral     SpO2 96 %     Weight      Height      Head Circumference      Peak Flow      Pain Score 7     Pain Loc      Pain Edu?      Excl. in [place]?    No data found.  Updated Vital Signs BP 133/73 (BP Location: Right Arm)    Pulse (!) 105    Temp 99 F (37.2 Carroll) (Oral)    Resp 18  SpO2 96%   Visual Acuity Right Eye Distance:   Left Eye Distance:   Bilateral Distance:    Right Eye Near:   Left Eye Near:    Bilateral Near:     Physical Exam Vitals reviewed.  Constitutional:      General: He is not in acute distress.    Appearance: Normal appearance. He is not ill-appearing.  HENT:     Head: Normocephalic and atraumatic.     Right Ear: Tympanic membrane, ear canal and external ear normal. No tenderness. No middle ear effusion. There is no impacted cerumen. Tympanic membrane is not perforated, erythematous, retracted or bulging.     Left Ear: Tympanic membrane, ear canal and external ear normal. No tenderness.  No middle ear effusion. There is no impacted cerumen. Tympanic membrane is not perforated, erythematous, retracted or bulging.     Nose: Nose normal. No congestion.      Mouth/Throat:     Mouth: Mucous membranes are moist.     Pharynx: Uvula midline. No oropharyngeal exudate or posterior oropharyngeal erythema.  Eyes:     Extraocular Movements: Extraocular movements intact.     Pupils: Pupils are equal, round, and reactive to light.  Cardiovascular:     Rate and Rhythm: Regular rhythm. Tachycardia present.     Heart sounds: Normal heart sounds.  Pulmonary:     Effort: Pulmonary effort is normal.     Breath sounds: Normal breath sounds. No decreased breath sounds, wheezing, rhonchi or rales.  Abdominal:     Palpations: Abdomen is soft.     Tenderness: There is no abdominal tenderness. There is no guarding or rebound.  Lymphadenopathy:     Cervical: No cervical adenopathy.     Right cervical: No superficial cervical adenopathy.    Left cervical: No superficial cervical adenopathy.  Neurological:     General: No focal deficit present.     Mental Status: He is alert and oriented to person, place, and time.  Psychiatric:        Mood and Affect: Mood normal.        Behavior: Behavior normal.        Thought Content: Thought content normal.        Judgment: Judgment normal.     UC Treatments / Results  Labs (all labs ordered are listed, but only abnormal results are displayed) Labs Reviewed  COVID-19, FLU A+B NAA    EKG   Radiology No results found.  Procedures Procedures (including critical care time)  Medications Ordered in UC Medications - No data to display  Initial Impression / Assessment and Plan / UC Course  I have reviewed the triage vital signs and the nursing notes.  Pertinent labs & imaging results that were available during my care of the patient were reviewed by me and considered in my medical decision making (see chart for details).     This patient is a very pleasant 35 y.o. year old male presenting with suspected influenza. Today this pt is borderline tachy but afebrile nontachypneic, oxygenating well on room air, no  wheezes rhonchi or rales.   Covid and influenza PCR sent. Patient prefers to start tamiflu while awaiting test results. Zofran ODT sent to have on hand. Continue OTC medications.   Work note provided. ED return precautions discussed. Patient verbalizes understanding and agreement.   Coding Level 4 for acute illness with systemic symptoms, and prescription drug management  Final Clinical Impressions(s) / UC Diagnoses   Final diagnoses:  Exposure  to the flu  Influenza with respiratory manifestation     Discharge Instructions      -Tamiflu twice daily x5 days. This medication can cause nausea, so I also sent nausea medication. You can stop the Tamiflu if you don't like it or if it causes side effects.  -Take the Zofran (ondansetron) up to 3 times daily for nausea and vomiting. -For fevers/chills, bodyaches, headaches- You can take Tylenol up to 1000 mg 3 times daily, and ibuprofen up to 600 mg 3 times daily with food.  You can take these together, or alternate every 3-4 hours. -Drink plenty of water/gatorade and get plenty of rest -With a virus, you're typically contagious for 5-7 days, or as long as you're having fevers.  -Come back and see [place] if things are getting worse instead of better, like shortness of breath, chest pain, fevers and chills that are getting higher instead of lower and do not come down with Tylenol or ibuprofen, etc.      ED Prescriptions     Medication Sig Dispense Auth. Provider   oseltamivir (TAMIFLU) 75 MG capsule Take 1 capsule (75 mg total) by mouth every 12 (twelve) hours. 10 capsule Marin Roberts E, PA-Carroll   ondansetron (ZOFRAN-ODT) 8 MG disintegrating tablet Take 1 tablet (8 mg total) by mouth every 8 (eight) hours as needed for nausea or vomiting. 20 tablet Hazel Sams, PA-[place]roll      PDMP not reviewed this encounter.   Hazel Sams, PA-Carroll 07/31/21 1037

## 2021-07-31 NOTE — ED Triage Notes (Signed)
Sore throat, body aches, fatigue, nasal drainage, chills since yesterday.

## 2021-08-01 LAB — COVID-19, FLU A+B NAA
Influenza A, NAA: NOT DETECTED
Influenza B, NAA: NOT DETECTED
SARS-CoV-2, NAA: NOT DETECTED

## 2021-08-02 ENCOUNTER — Other Ambulatory Visit: Payer: Self-pay

## 2021-08-02 ENCOUNTER — Encounter (HOSPITAL_COMMUNITY): Payer: Self-pay

## 2021-08-02 ENCOUNTER — Ambulatory Visit (HOSPITAL_COMMUNITY)
Admission: EM | Admit: 2021-08-02 | Discharge: 2021-08-02 | Disposition: A | Discharge status: Home / Self Care | Payer: BC Managed Care – PPO | Attending: Emergency Medicine | Admitting: Emergency Medicine

## 2021-08-02 ENCOUNTER — Ambulatory Visit: Payer: BC Managed Care – PPO

## 2021-08-02 DIAGNOSIS — J020 Streptococcal pharyngitis: Secondary | ICD-10-CM

## 2021-08-02 LAB — POCT RAPID STREP A, ED / UC: Streptococcus, Group A Screen (Direct): POSITIVE — AB

## 2021-08-02 MED ORDER — LIDOCAINE VISCOUS HCL 2 % MT SOLN: 15.0000 mL | OROMUCOSAL | 0 refills | Status: DC | PRN

## 2021-08-02 MED ORDER — AMOXICILLIN 500 MG PO CAPS: 500.0000 mg | ORAL_CAPSULE | Freq: Two times a day (BID) | ORAL | 0 refills | Status: AC

## 2021-08-02 NOTE — Discharge Instructions (Signed)
Your rapid strep test today was positive  Take amoxicillin twice a day for 10 days   You may gargle and spit lidocaine solution every 4 hours as needed for temporary relief of your sore throat  Continue  use ibuprofen and Tylenol for additional comfort  You may follow-up at urgent care as needed

## 2021-08-02 NOTE — ED Triage Notes (Signed)
Pt presents with a sore throat and nasal drainage.   States he was tested for FLU/COVID and was negative.   States he still has sxs and wants to know what he has so that he can treat his sxs.

## 2021-08-02 NOTE — ED Provider Notes (Signed)
[facility]    CSN: [id number] Arrival date & time: 08/02/21  1046      History   Chief Complaint Chief Complaint  Patient presents with   Sore Throat    HPI Andrew Carroll is a 35 y.o. male.   Patient presents with nasal congestion, rhinorrhea, sore throat for 3 days. Endorses initial body aches have improved. Worsening sore throat. Painful to swallow.  Has attempted use of tylenol and ibuprofen which has been helpful. Recent flu and covid test negative.   History reviewed. No pertinent past medical history.  There are no problems to display for this patient.   History reviewed. No pertinent surgical history.     Home Medications    Prior to Admission medications   Medication Sig Start Date End Date Taking? Authorizing Provider  Cetirizine HCl 10 MG CAPS Take 1 capsule (10 mg total) by mouth daily for 10 days. 03/21/18 03/31/18  Wieters, Hallie C, PA-C  fluticasone (FLONASE) 50 MCG/ACT nasal spray Place 1-2 sprays into both nostrils daily. 03/21/18   Wieters, Hallie C, PA-C  ondansetron (ZOFRAN-ODT) 8 MG disintegrating tablet Take 1 tablet (8 mg total) by mouth every 8 (eight) hours as needed for nausea or vomiting. 07/31/21   Rhys Martini, PA-C  oseltamivir (TAMIFLU) 75 MG capsule Take 1 capsule (75 mg total) by mouth every 12 (twelve) hours. 07/31/21   Rhys Martini, PA-C    Family History Family History  Problem Relation Age of Onset   Healthy Mother    Healthy Father     Social History Social History   Tobacco Use   Smoking status: Former   Smokeless tobacco: Never  Building services engineer Use: Never used  Substance Use Topics   Alcohol use: Not Currently   Drug use: No     Allergies   Patient has no known allergies.   Review of Systems Review of Systems  Constitutional: Negative.   HENT:  Positive for congestion, rhinorrhea and sore throat. Negative for dental problem, drooling, ear discharge, ear pain, facial swelling, hearing  loss, mouth sores, nosebleeds, postnasal drip, sinus pressure, sinus pain, sneezing, tinnitus, trouble swallowing and voice change.   Respiratory: Negative.    Cardiovascular: Negative.   Gastrointestinal: Negative.   Neurological: Negative.     Physical Exam Triage Vital Signs ED Triage Vitals  Enc Vitals Group     BP 08/02/21 1117 102/73     Pulse Rate 08/02/21 1116 85     Resp 08/02/21 1116 17     Temp 08/02/21 1116 98.8 F (37.1 C)     Temp Source 08/02/21 1116 Oral     SpO2 08/02/21 1116 97 %     Weight --      Height --      Head Circumference --      Peak Flow --      Pain Score 08/02/21 1115 4     Pain Loc --      Pain Edu? --      Excl. in [place]? --    No data found.  Updated Vital Signs BP 102/73    Pulse 85    Temp 98.8 F (37.1 C) (Oral)    Resp 17    SpO2 97%   Visual Acuity Right Eye Distance:   Left Eye Distance:   Bilateral Distance:    Right Eye Near:   Left Eye Near:    Bilateral Near:     Physical Exam  Constitutional:      Appearance: Normal appearance. He is normal weight.  HENT:     Head: Normocephalic.     Right Ear: Tympanic membrane, ear canal and external ear normal.     Left Ear: Ear canal and external ear normal.     Nose: Congestion present.     Mouth/Throat:     Mouth: Mucous membranes are moist.     Pharynx: Posterior oropharyngeal erythema present.     Tonsils: No tonsillar exudate. 2+ on the right. 2+ on the left.  Cardiovascular:     Rate and Rhythm: Normal rate and regular rhythm.     Pulses: Normal pulses.     Heart sounds: Normal heart sounds.  Pulmonary:     Effort: Pulmonary effort is normal.     Breath sounds: Normal breath sounds.  Skin:    General: Skin is warm and dry.  Neurological:     Mental Status: He is alert and oriented to person, place, and time. Mental status is at baseline.  Psychiatric:        Mood and Affect: Mood normal.        Behavior: Behavior normal.     UC Treatments / Results  Labs (all  labs ordered are listed, but only abnormal results are displayed) Labs Reviewed  POCT RAPID STREP A, ED / UC    EKG   Radiology No results found.  Procedures Procedures (including critical care time)  Medications Ordered in UC Medications - No data to display  Initial Impression / Assessment and Plan / UC Course  I have reviewed the triage vital signs and the nursing notes.  Pertinent labs & imaging results that were available during my care of the patient were reviewed by me and considered in my medical decision making (see chart for details).  Strep pharyngitis   amoxicillin 500 mg bid for 10 days  Lidocaine viscous 2% 15 Ml every 4 hour Continued use of otc medications, may attempt salt water gargle, throat lozenges and warm tea in addition  UC follow up as  needed  Final Clinical Impressions(s) / UC Diagnoses   Final diagnoses:  None   Discharge Instructions   None    ED Prescriptions   None    PDMP not reviewed this encounter.   Valinda Hoar, NP 08/02/21 1246

## 2022-04-01 ENCOUNTER — Telehealth: Payer: BC Managed Care – PPO | Admitting: Emergency Medicine

## 2022-04-01 DIAGNOSIS — J329 Chronic sinusitis, unspecified: Secondary | ICD-10-CM | POA: Diagnosis not present

## 2022-04-01 MED ORDER — IPRATROPIUM BROMIDE 0.03 % NA SOLN: 2.0000 | Freq: Two times a day (BID) | NASAL | 0 refills | Status: DC

## 2022-04-01 NOTE — Progress Notes (Signed)
E-Visit for Sinus Problems  We are sorry that you are not feeling well.  Here is how we plan to help!  Based on what you have shared with me it looks like you have sinusitis.  Sinusitis is inflammation and infection in the sinus cavities of the head.  Based on your presentation I believe you most likely have Acute Viral Sinusitis.This is an infection most likely caused by a virus. There is not specific treatment for viral sinusitis other than to help you with the symptoms until the infection runs its course.  You may use an oral decongestant such as Mucinex D or if you have glaucoma or high blood pressure use plain Mucinex. Saline nasal spray help and can safely be used as often as needed for congestion, I have prescribed: Ipratropium Bromide nasal spray 0.03% 2 sprays in eah nostril 2-3 times a day.  This should significantly reduce the congestion.  I recommend taking an OTC antihistamine like Zyrtec as well.  If you still have symptoms at the 10 day mark, you may need an antibiotic.  Some authorities believe that zinc sprays or the use of Echinacea may shorten the course of your symptoms.  Sinus infections are not as easily transmitted as other respiratory infection, however we still recommend that you avoid close contact with loved ones, especially the very young and elderly.  Remember to wash your hands thoroughly throughout the day as this is the number one way to prevent the spread of infection!  Home Care: Only take medications as instructed by your medical team. Do not take these medications with alcohol. A steam or ultrasonic humidifier can help congestion.  You can place a towel over your head and breathe in the steam from hot water coming from a faucet. Avoid close contacts especially the very young and the elderly. Cover your mouth when you cough or sneeze. Always remember to wash your hands.  Get Help Right Away If: You develop worsening fever or sinus pain. You develop a severe head  ache or visual changes. Your symptoms persist after you have completed your treatment plan.  Make sure you Understand these instructions. Will watch your condition. Will get help right away if you are not doing well or get worse.   Thank you for choosing an e-visit.  Your e-visit answers were reviewed by a board certified advanced clinical practitioner to complete your personal care plan. Depending upon the condition, your plan could have included both over the counter or prescription medications.  Please review your pharmacy choice. Make sure the pharmacy is open so you can pick up prescription now. If there is a problem, you may contact your provider through [employer] and have the prescription routed to another pharmacy.  Your safety is important to [place]. If you have drug allergies check your prescription carefully.   For the next 24 hours you can use MyChart to ask questions about today's visit, request a non-urgent call back, or ask for a work or school excuse. You will get an email in the next two days asking about your experience. I hope that your e-visit has been valuable and will speed your recovery.  Approximately 5 minutes was used in reviewing the patient's chart, questionnaire, prescribing medications, and documentation.

## 2022-06-19 ENCOUNTER — Telehealth: Payer: BC Managed Care – PPO | Admitting: Physician Assistant

## 2022-06-19 DIAGNOSIS — U071 COVID-19: Secondary | ICD-10-CM | POA: Diagnosis not present

## 2022-06-19 MED ORDER — BENZONATATE 100 MG PO CAPS: 100.0000 mg | ORAL_CAPSULE | Freq: Three times a day (TID) | ORAL | 0 refills | Status: DC | PRN

## 2022-06-19 NOTE — Progress Notes (Signed)
E-Visit  for Positive Covid Test Result  We are sorry you are not feeling well. We are here to help!  You have tested positive for COVID-19, meaning that you were infected with the novel coronavirus and could give the virus to others.  It is vitally important that you stay home so you do not spread it to others.      Please continue isolation at home, for at least 10 days since the start of your symptoms and until you have had 24 hours with no fever (without taking a fever reducer) and with improving of symptoms.  If you have no symptoms but tested positive (or all symptoms resolve after 5 days and you have no fever) you can leave your house but continue to wear a mask around others for an additional 5 days. If you have a fever,continue to stay home until you have had 24 hours of no fever. Most cases improve 5-10 days from onset but we have seen a small number of patients who have gotten worse after the 10 days.  Please be sure to watch for worsening symptoms and remain taking the proper precautions.   Go to the nearest hospital ED for assessment if fever/cough/breathlessness are severe or illness seems like a threat to life.    The following symptoms may appear 2-14 days after exposure: Fever Cough Shortness of breath or difficulty breathing Chills Repeated shaking with chills Muscle pain Headache Sore throat New loss of taste or smell Fatigue Congestion or runny nose Nausea or vomiting Diarrhea  You have been enrolled in MyChart Home Monitoring for COVID-19. Daily you will receive a questionnaire within the MyChart website. Our COVID-19 response team will be monitoring your responses daily.  You can use medication such as OTC Tylenol or Ibuprofen, Mucinex-D for congestion and co ugh. I have place a prescription cough medication on file at your pharmacy, [facility], just in case cough worsens. You can take this along with the OTC medications.  I also recommend 1000 mg Vitamin C daily and  1000 units of Vitamin d# daily for immune support.   I have sent a work note to Pharmacologist. You can find by going to the Menu on your homepage, scrolling down to the Communications section, and selecting Letters. Let us know if you have any issue locating. Take care and feel better soon!   You may also take acetaminophen (Tylenol) as needed for fever.  HOME CARE: Only take medications as instructed by your medical team. Drink plenty of fluids and get plenty of rest. A steam or ultrasonic humidifier can help if you have congestion.   GET HELP RIGHT AWAY IF YOU HAVE EMERGENCY WARNING SIGNS.  Call 911 or proceed to your closest emergency facility if: You develop worsening high fever. Trouble breathing Bluish lips or face Persistent pain or pressure in the chest New confusion Inability to wake or stay awake You cough up blood. Your symptoms become more severe Inability to hold down food or fluids  This list is not all possible symptoms. Contact your medical provider for any symptoms that are severe or concerning to you.    Your e-visit answers were reviewed by a board certified advanced clinical practitioner to complete your personal care plan.  Depending on the condition, your plan could have included both over the counter or prescription medications.  If there is a problem please reply once you have received a response from your provider.  Your safety is important to [place].  If  you have drug allergies check your prescription carefully.    You can use MyChart to ask questions about today's visit, request a non-urgent call back, or ask for a work or school excuse for 24 hours related to this e-Visit. If it has been greater than 24 hours you will need to follow up with your provider, or enter a new e-Visit to address those concerns. You will get an e-mail in the next two days asking about your experience.  I hope that your e-visit has been valuable and will speed your recovery. Thank you  for using e-visits.

## 2022-06-19 NOTE — Progress Notes (Signed)
I have spent 5 minutes in review of e-visit questionnaire, review and updating patient chart, medical decision making and response to patient.   Artie Cody Jabbar Palmero, PA-C    

## 2022-06-27 ENCOUNTER — Ambulatory Visit
Admission: EM | Admit: 2022-06-27 | Discharge: 2022-06-27 | Disposition: A | Discharge status: Home / Self Care | Payer: BC Managed Care – PPO | Attending: Nurse Practitioner | Admitting: Nurse Practitioner

## 2022-06-27 DIAGNOSIS — J039 Acute tonsillitis, unspecified: Secondary | ICD-10-CM | POA: Diagnosis not present

## 2022-06-27 LAB — POCT RAPID STREP A (OFFICE): Rapid Strep A Screen: NEGATIVE

## 2022-06-27 MED ORDER — LIDOCAINE VISCOUS HCL 2 % MT SOLN: 15.0000 mL | Freq: Four times a day (QID) | OROMUCOSAL | 0 refills | Status: DC | PRN

## 2022-06-27 MED ORDER — PREDNISONE 20 MG PO TABS: 40.0000 mg | ORAL_TABLET | Freq: Every day | ORAL | 0 refills | Status: AC

## 2022-06-27 NOTE — Discharge Instructions (Addendum)
Rapid strep test is negative, throat culture is pending.  You will be contacted if the pending test results are positive to provide treatment. Take medication as prescribed. Increase fluids and allow for plenty of rest. Recommend Tylenol or ibuprofen as needed for pain, fever, or general discomfort. Recommend throat lozenges, Chloraseptic or honey to help with throat pain. Warm salt water gargles 3-4 times daily to help with throat pain or discomfort. Recommend a diet with soft foods to include soups, broths, puddings, yogurt, Jell-O's, or popsicles until symptoms improve. Follow-up if symptoms do not improve.

## 2022-06-27 NOTE — ED Triage Notes (Signed)
Pt reports sore throat and coughing which started yesterday. Took ibuprofen which slightly improved throat pain.  Hard to swallow  Family just got over covid. He tested positive 10 days ago. Did not retest to see if he was negative.

## 2022-06-27 NOTE — ED Provider Notes (Signed)
[facility]    CSN: [id number] Arrival date & time: 06/27/22  1633      History   Chief Complaint Chief Complaint  Patient presents with   Sore Throat    Feels like strep? - Entered by patient    HPI Andrew Carroll is a 36 y.o. male.   The history is provided by the patient.   Patient reports a 1 day history of sore throat and cough.  Patient denies fever, chills, headache, abdominal pain, nausea, vomiting, or diarrhea.  Patient states it is since become difficult for him to swallow.  He reports that he did test positive for COVID approximately 10 days ago but feel that his COVID symptoms have improved.  He has been taking ibuprofen for his pain with some relief.  He denies any known sick contacts.  History reviewed. No pertinent past medical history.  There are no problems to display for this patient.   History reviewed. No pertinent surgical history.     Home Medications    Prior to Admission medications   Medication Sig Start Date End Date Taking? Authorizing Provider  lidocaine (XYLOCAINE) 2 % solution Use as directed 15 mLs in the mouth or throat every 6 (six) hours as needed for mouth pain. 06/27/22  Yes Naava Janeway-Warren, Sadie Haber, NP  predniSONE (DELTASONE) 20 MG tablet Take 2 tablets (40 mg total) by mouth daily with breakfast for 5 days. 06/27/22 07/02/22 Yes Ruthia Person-Warren, Sadie Haber, NP  benzonatate (TESSALON) 100 MG capsule Take 1 capsule (100 mg total) by mouth 3 (three) times daily as needed for cough. 06/19/22   Waldon Merl, PA-C  Cetirizine HCl 10 MG CAPS Take 1 capsule (10 mg total) by mouth daily for 10 days. 03/21/18 03/31/18  Wieters, Hallie C, PA-C  fluticasone (FLONASE) 50 MCG/ACT nasal spray Place 1-2 sprays into both nostrils daily. 03/21/18   Wieters, Hallie C, PA-C  ipratropium (ATROVENT) 0.03 % nasal spray Place 2 sprays into both nostrils every 12 (twelve) hours. 04/01/22   Roxy Horseman, PA-C  ondansetron (ZOFRAN-ODT) 8 MG  disintegrating tablet Take 1 tablet (8 mg total) by mouth every 8 (eight) hours as needed for nausea or vomiting. 07/31/21   Rhys Martini, PA-C    Family History Family History  Problem Relation Age of Onset   Healthy Mother    Healthy Father     Social History Social History   Tobacco Use   Smoking status: Former   Smokeless tobacco: Never  Building services engineer Use: Never used  Substance Use Topics   Alcohol use: Not Currently   Drug use: No     Allergies   Patient has no known allergies.   Review of Systems Review of Systems Per HPI  Physical Exam Triage Vital Signs ED Triage Vitals  Enc Vitals Group     BP 06/27/22 1651 130/79     Pulse Rate 06/27/22 1651 68     Resp 06/27/22 1651 20     Temp 06/27/22 1651 97.9 F (36.6 C)     Temp Source 06/27/22 1651 Oral     SpO2 06/27/22 1651 98 %     Weight --      Height --      Head Circumference --      Peak Flow --      Pain Score 06/27/22 1654 7     Pain Loc --      Pain Edu? --  Excl. in [place]? --    No data found.  Updated Vital Signs BP 130/79 (BP Location: Right Arm)   Pulse 68   Temp 97.9 F (36.6 C) (Oral)   Resp 20   SpO2 98%   Visual Acuity Right Eye Distance:   Left Eye Distance:   Bilateral Distance:    Right Eye Near:   Left Eye Near:    Bilateral Near:     Physical Exam Vitals and nursing note reviewed.  Constitutional:      General: He is not in acute distress.    Appearance: He is well-developed.  HENT:     Head: Normocephalic.     Right Ear: Tympanic membrane and ear canal normal.     Left Ear: Tympanic membrane and ear canal normal.     Nose: No congestion.     Mouth/Throat:     Mouth: Mucous membranes are moist.     Pharynx: Uvula midline. Pharyngeal swelling, oropharyngeal exudate, posterior oropharyngeal erythema and uvula swelling present.     Tonsils: 2+ on the right. 2+ on the left.  Eyes:     Conjunctiva/sclera: Conjunctivae normal.     Pupils: Pupils are  equal, round, and reactive to light.  Cardiovascular:     Rate and Rhythm: Normal rate and regular rhythm.     Heart sounds: Normal heart sounds.  Pulmonary:     Effort: Pulmonary effort is normal. No respiratory distress.     Breath sounds: Normal breath sounds. No stridor. No wheezing, rhonchi or rales.  Abdominal:     General: Bowel sounds are normal.     Palpations: Abdomen is soft.     Tenderness: There is no abdominal tenderness.  Musculoskeletal:     Cervical back: Normal range of motion.  Lymphadenopathy:     Cervical: No cervical adenopathy.  Skin:    General: Skin is warm and dry.  Neurological:     General: No focal deficit present.     Mental Status: He is alert.  Psychiatric:        Mood and Affect: Mood normal.        Behavior: Behavior normal.      UC Treatments / Results  Labs (all labs ordered are listed, but only abnormal results are displayed) Labs Reviewed  CULTURE, GROUP A STREP [facility])  POCT RAPID STREP A (OFFICE)    EKG   Radiology No results found.  Procedures Procedures (including critical care time)  Medications Ordered in UC Medications - No data to display  Initial Impression / Assessment and Plan / UC Course  I have reviewed the triage vital signs and the nursing notes.  Pertinent labs & imaging results that were available during my care of the patient were reviewed by me and considered in my medical decision making (see chart for details).  Patient is well-appearing, he is in no acute distress, vital signs are stable.  Rapid strep test is negative, throat culture is pending.  Patient has uvula swelling, +2 tonsil swelling, and exudate present.  Acute tonsillitis, unspecified etiology  Treat with prednisone 40 mg and lidocaine 2% solution for throat pain and swelling. Increase fluids and allow for plenty of rest. Recommend Tylenol or ibuprofen as needed for pain, fever, or general discomfort. Recommend throat lozenges,  Chloraseptic, or honey to help with throat pain or discomfort. Warm salt water gargles 3-4 times daily while symptoms persist. Recommend a soft diet while symptoms persist. Patient will be contacted if the culture results  are positive to provide treatment. Follow-up as needed.  Patient verbalizes understanding.  All questions were answered.  Patient is stable for discharge.  Final Clinical Impressions(s) / UC Diagnoses   Final diagnoses:  Acute tonsillitis, unspecified etiology     Discharge Instructions      Rapid strep test is negative, throat culture is pending.  You will be contacted if the pending test results are positive to provide treatment. Take medication as prescribed. Increase fluids and allow for plenty of rest. Recommend Tylenol or ibuprofen as needed for pain, fever, or general discomfort. Recommend throat lozenges, Chloraseptic or honey to help with throat pain. Warm salt water gargles 3-4 times daily to help with throat pain or discomfort. Recommend a diet with soft foods to include soups, broths, puddings, yogurt, Jell-O's, or popsicles until symptoms improve. Follow-up if symptoms do not improve.       ED Prescriptions     Medication Sig Dispense Auth. Provider   predniSONE (DELTASONE) 20 MG tablet Take 2 tablets (40 mg total) by mouth daily with breakfast for 5 days. 10 tablet Hartleigh Edmonston-Warren, Sadie Haber, NP   lidocaine (XYLOCAINE) 2 % solution Use as directed 15 mLs in the mouth or throat every 6 (six) hours as needed for mouth pain. 100 mL Valia Wingard-Warren, Sadie Haber, NP      PDMP not reviewed this encounter.   Abran Cantor, NP 06/27/22 1731

## 2022-06-30 LAB — CULTURE, GROUP A STREP (THRC)

## 2022-11-14 ENCOUNTER — Ambulatory Visit
Admission: RE | Admit: 2022-11-14 | Discharge: 2022-11-14 | Disposition: A | Discharge status: Home / Self Care | Payer: BC Managed Care – PPO | Source: Ambulatory Visit | Attending: Nurse Practitioner | Admitting: Nurse Practitioner

## 2022-11-14 VITALS — BP 113/70 | HR 82 | Temp 98.1°F | Resp 18

## 2022-11-14 DIAGNOSIS — R21 Rash and other nonspecific skin eruption: Secondary | ICD-10-CM

## 2022-11-14 MED ORDER — CLOTRIMAZOLE-BETAMETHASONE 1-0.05 % EX CREA: TOPICAL_CREAM | CUTANEOUS | 0 refills | Status: DC

## 2022-11-14 NOTE — ED Triage Notes (Signed)
Pt c/o rash that has been on top left  foot (located on big toe and top half of foot)  x 2 mo's   Pt has tried lotions, tinactin, itch creams and gold bond psoriasis none have alleviated the discomfort or itchiness   Pt unaware of what cause the rash.

## 2022-11-14 NOTE — ED Provider Notes (Signed)
[facility]    CSN: [id number] Arrival date & time: 11/14/22  1755      History   Chief Complaint Chief Complaint  Patient presents with   Rash    There has been a rash on my foot for 2 months - Entered by patient    HPI Andrew Carroll is a 37 y.o. male.   The history is provided by the patient.   The patient presents for complaints of rash to the left foot has been present for the past 2 months.  Rash is located on the big toe and on the top of the left foot underneath the second and third toes.  Patient states that the rash is nonpainful, but itchy.  He states that he got new work boots around the time that his symptoms started, but he has since had new work boots and has had no problem.  He states that the rash does not bother him when he is at work, rash worsens when he gets home and gets in the shower, which is when the itching is the worst.  Patient denies new soaps, detergents, medications, foods, lotions, or body washes.  Patient states he has tried lotions, Tinactin's, itch creams, and Goldbond psoriasis with minimal relief.  Patient denies history of eczema or psoriasis.  History reviewed. No pertinent past medical history.  There are no problems to display for this patient.   History reviewed. No pertinent surgical history.     Home Medications    Prior to Admission medications   Medication Sig Start Date End Date Taking? Authorizing Provider  clotrimazole-betamethasone (LOTRISONE) cream Apply to affected area 2 times daily until symptoms improve. 11/14/22  Yes Quinnley Colasurdo-Warren, Alda Lea, NP  benzonatate (TESSALON) 100 MG capsule Take 1 capsule (100 mg total) by mouth 3 (three) times daily as needed for cough. 06/19/22   Brunetta Jeans, PA-C  Cetirizine HCl 10 MG CAPS Take 1 capsule (10 mg total) by mouth daily for 10 days. 03/21/18 03/31/18  Wieters, Hallie C, PA-C  fluticasone (FLONASE) 50 MCG/ACT nasal spray Place 1-2 sprays into both nostrils daily.  03/21/18   Wieters, Hallie C, PA-C  ipratropium (ATROVENT) 0.03 % nasal spray Place 2 sprays into both nostrils every 12 (twelve) hours. 04/01/22   Montine Circle, PA-C  lidocaine (XYLOCAINE) 2 % solution Use as directed 15 mLs in the mouth or throat every 6 (six) hours as needed for mouth pain. 06/27/22   Kelsen Celona-Warren, Alda Lea, NP  ondansetron (ZOFRAN-ODT) 8 MG disintegrating tablet Take 1 tablet (8 mg total) by mouth every 8 (eight) hours as needed for nausea or vomiting. 07/31/21   Hazel Sams, PA-C    Family History Family History  Problem Relation Age of Onset   Healthy Mother    Healthy Father     Social History Social History   Tobacco Use   Smoking status: Former   Smokeless tobacco: Never  Vaping Use   Vaping Use: Never used  Substance Use Topics   Alcohol use: Not Currently   Drug use: No     Allergies   Patient has no known allergies.   Review of Systems Review of Systems Per HPI  Physical Exam Triage Vital Signs ED Triage Vitals  Enc Vitals Group     BP 11/14/22 1805 113/70     Pulse Rate 11/14/22 1805 82     Resp 11/14/22 1805 18     Temp 11/14/22 1805 98.1 F (36.7 C)  Temp Source 11/14/22 1805 Oral     SpO2 11/14/22 1805 96 %     Weight --      Height --      Head Circumference --      Peak Flow --      Pain Score 11/14/22 1806 0     Pain Loc --      Pain Edu? --      Excl. in [place]? --    No data found.  Updated Vital Signs BP 113/70 (BP Location: Right Arm)   Pulse 82   Temp 98.1 F (36.7 C) (Oral)   Resp 18   SpO2 96%   Visual Acuity Right Eye Distance:   Left Eye Distance:   Bilateral Distance:    Right Eye Near:   Left Eye Near:    Bilateral Near:     Physical Exam Vitals and nursing note reviewed.  Constitutional:      General: He is not in acute distress.    Appearance: Normal appearance.  HENT:     Head: Normocephalic.  Eyes:     Extraocular Movements: Extraocular movements intact.     Pupils: Pupils are  equal, round, and reactive to light.  Pulmonary:     Effort: Pulmonary effort is normal.  Skin:    General: Skin is warm and dry.     Comments: Rash located to the dorsal aspect of the left foot on the great toe and on the dorsal aspect of the foot under the second and third toes.  Rash does not extend over the midfoot or on the sole of the foot.  Rash is crusting, has an erythematous base, macules present.  No blisters, vesicles, or pustules noted.  No rash present in or  between the toes.  Neurological:     General: No focal deficit present.     Mental Status: He is alert and oriented to person, place, and time.  Psychiatric:        Mood and Affect: Mood normal.        Behavior: Behavior normal.      UC Treatments / Results  Labs (all labs ordered are listed, but only abnormal results are displayed) Labs Reviewed - No data to display  EKG   Radiology No results found.  Procedures Procedures (including critical care time)  Medications Ordered in UC Medications - No data to display  Initial Impression / Assessment and Plan / UC Course  I have reviewed the triage vital signs and the nursing notes.  Pertinent labs & imaging results that were available during my care of the patient were reviewed by me and considered in my medical decision making (see chart for details).  The patient is well-appearing, he is in no acute distress, vital signs are stable.  Patient with rash to the top of the left foot.  Differential diagnoses include contact dermatitis, tinea pedis, and eczema.  Will treat patient with Lotrisone cream.  Supportive care recommendations were provided and discussed with the patient to include use of lukewarm water to help avoid increased inflammation and itching, keeping the area clean and dry, and leaving the area open to air is much as possible.  Patient was advised that if symptoms do not improve, would like for him to follow-up in this clinic or with his primary  care physician for further evaluation.  Patient is in agreement with this plan of care and verbalizes understanding.  All questions were answered.  Patient stable for discharge.  Final Clinical Impressions(s) / UC Diagnoses   Final diagnoses:  Rash and nonspecific skin eruption     Discharge Instructions      Take medication as prescribed. May also take over-the-counter Zyrtec to help with itching.  If needed, may also use topical Benadryl itch cream as needed. Avoid hot baths or showers while symptoms persist.  Recommend taking lukewarm baths. May apply cool cloths to the area to help with itching or discomfort. Avoid scratching, rubbing, or manipulating the areas while symptoms persist. Recommend Aveeno colloidal oatmeal bath to use to help with drying and itching. As discussed, if symptoms do not improve with this treatment, lease follow-up in this clinic or with your primary care physician for further evaluation. Follow-up as needed.     ED Prescriptions     Medication Sig Dispense Auth. Provider   clotrimazole-betamethasone (LOTRISONE) cream Apply to affected area 2 times daily until symptoms improve. 30 g Keiarah Orlowski-Warren, Alda Lea, NP      PDMP not reviewed this encounter.   Tish Men, NP 11/14/22 [phone]

## 2022-11-14 NOTE — Discharge Instructions (Addendum)
Take medication as prescribed. May also take over-the-counter Zyrtec to help with itching.  If needed, may also use topical Benadryl itch cream as needed. Avoid hot baths or showers while symptoms persist.  Recommend taking lukewarm baths. May apply cool cloths to the area to help with itching or discomfort. Avoid scratching, rubbing, or manipulating the areas while symptoms persist. Recommend Aveeno colloidal oatmeal bath to use to help with drying and itching. As discussed, if symptoms do not improve with this treatment, lease follow-up in this clinic or with your primary care physician for further evaluation. Follow-up as needed.

## 2022-11-28 ENCOUNTER — Ambulatory Visit
Admission: RE | Admit: 2022-11-28 | Discharge: 2022-11-28 | Disposition: A | Discharge status: Home / Self Care | Payer: BC Managed Care – PPO | Source: Ambulatory Visit | Attending: Nurse Practitioner | Admitting: Nurse Practitioner

## 2022-11-28 VITALS — BP 122/72 | HR 84 | Temp 98.3°F | Resp 18

## 2022-11-28 DIAGNOSIS — R21 Rash and other nonspecific skin eruption: Secondary | ICD-10-CM | POA: Diagnosis not present

## 2022-11-28 MED ORDER — TRIAMCINOLONE ACETONIDE 0.1 % EX CREA: 1.0000 | TOPICAL_CREAM | Freq: Two times a day (BID) | CUTANEOUS | 0 refills | Status: DC

## 2022-11-28 MED ORDER — DEXAMETHASONE SODIUM PHOSPHATE 10 MG/ML IJ SOLN
10.0000 mg | INTRAMUSCULAR | Status: AC
  Administered 2022-11-28: 10 mg via INTRAMUSCULAR

## 2022-11-28 MED ORDER — FAMOTIDINE 20 MG PO TABS: 20.0000 mg | ORAL_TABLET | Freq: Two times a day (BID) | ORAL | 0 refills | Status: DC

## 2022-11-28 MED ORDER — PREDNISONE 50 MG PO TABS: ORAL_TABLET | ORAL | 0 refills | Status: DC

## 2022-11-28 NOTE — ED Provider Notes (Signed)
[facility]    CSN: [id number] Arrival date & time: 11/28/22  1350      History   Chief Complaint Chief Complaint  Patient presents with   Allergic Reaction    allergic reaction to the prescription ointment that I was prescribed. - Entered by patient    HPI Andrew Carroll is a 37 y.o. male.   The history is provided by the patient.   The patient presents for complaints of rash located under both arms, and on his calves.  Patient states that he was seen in this clinic on 11/14/2022 and prescribed Lotrisone cream for a rash to his foot.  He states 2-3 days after he used the cream, he developed the rash under his arms.  He states that the rash is itchy.  He denies fever, chills, oozing, drainage, exposure to new soaps, medications, detergents, lotions, or plants.  Patient states that he has not used the Lotrisone cream in several days, but the rash appears to be spreading.  Patient reports that he takes Zyrtec daily, and has been using Benadryl at nighttime for the rash.  The patient states the rash did help his foot.  History reviewed. No pertinent past medical history.  There are no problems to display for this patient.   History reviewed. No pertinent surgical history.     Home Medications    Prior to Admission medications   Medication Sig Start Date End Date Taking? Authorizing Provider  famotidine (PEPCID) 20 MG tablet Take 1 tablet (20 mg total) by mouth 2 (two) times daily. 11/28/22  Yes Salma Walrond-Warren, Sadie Haber, NP  predniSONE (DELTASONE) 50 MG tablet Take 1 tablet daily with breakfast for the next 5 days. 11/28/22  Yes Loree Shehata-Warren, Sadie Haber, NP  triamcinolone cream (KENALOG) 0.1 % Apply 1 Application topically 2 (two) times daily. 11/28/22  Yes Renold Kozar-Warren, Sadie Haber, NP  Cetirizine HCl 10 MG CAPS Take 1 capsule (10 mg total) by mouth daily for 10 days. 03/21/18 03/31/18  Wieters, Hallie C, PA-C  clotrimazole-betamethasone (LOTRISONE) cream Apply to  affected area 2 times daily until symptoms improve. 11/14/22   Muhammed Teutsch-Warren, Sadie Haber, NP  fluticasone (FLONASE) 50 MCG/ACT nasal spray Place 1-2 sprays into both nostrils daily. 03/21/18   Wieters, Hallie C, PA-C  ipratropium (ATROVENT) 0.03 % nasal spray Place 2 sprays into both nostrils every 12 (twelve) hours. 04/01/22   Roxy Horseman, PA-C    Family History Family History  Problem Relation Age of Onset   Healthy Mother    Healthy Father     Social History Social History   Tobacco Use   Smoking status: Former   Smokeless tobacco: Never  Building services engineer Use: Never used  Substance Use Topics   Alcohol use: Not Currently   Drug use: No     Allergies   Patient has no known allergies.   Review of Systems Review of Systems Per HPI  Physical Exam Triage Vital Signs ED Triage Vitals  Enc Vitals Group     BP 11/28/22 1412 122/72     Pulse Rate 11/28/22 1412 84     Resp 11/28/22 1412 18     Temp 11/28/22 1412 98.3 F (36.8 C)     Temp Source 11/28/22 1412 Oral     SpO2 11/28/22 1412 96 %     Weight --      Height --      Head Circumference --      Peak Flow --  Pain Score 11/28/22 1416 0     Pain Loc --      Pain Edu? --      Excl. in GC? --    No data found.  Updated Vital Signs BP 122/72 (BP Location: Right Arm)   Pulse 84   Temp 98.3 F (36.8 C) (Oral)   Resp 18   SpO2 96%   Visual Acuity Right Eye Distance:   Left Eye Distance:   Bilateral Distance:    Right Eye Near:   Left Eye Near:    Bilateral Near:     Physical Exam Vitals and nursing note reviewed.  Constitutional:      General: He is not in acute distress.    Appearance: Normal appearance.  HENT:     Head: Normocephalic.  Eyes:     Pupils: Pupils are equal, round, and reactive to light.  Pulmonary:     Effort: Pulmonary effort is normal.  Skin:    General: Skin is warm and dry.     Findings: Rash present. Rash is macular and papular.     Comments: Macular papular  rash noted under both axilla.  Rash is erythematous.  Rash is also noted to the bilateral lower legs, wrapping around the calf.  There is no oozing, fluctuance, or drainage present.  Neurological:     General: No focal deficit present.     Mental Status: He is alert and oriented to person, place, and time.  Psychiatric:        Mood and Affect: Mood normal.        Behavior: Behavior normal.      UC Treatments / Results  Labs (all labs ordered are listed, but only abnormal results are displayed) Labs Reviewed - No data to display  EKG   Radiology No results found.  Procedures Procedures (including critical care time)  Medications Ordered in UC Medications  dexamethasone (DECADRON) injection 10 mg (10 mg Intramuscular Given 11/28/22 1446)    Initial Impression / Assessment and Plan / UC Course  I have reviewed the triage vital signs and the nursing notes.  Pertinent labs & imaging results that were available during my care of the patient were reviewed by me and considered in my medical decision making (see chart for details).  The patient is well-appearing, he is in no acute distress, vital signs are stable.  Patient with rash under both axilla, and lower extremities.  Patient has been using Lotrisone cream for a rash on his foot.  He denies any direct contact with the Lotrisone cream to other areas of his body.  Do not feel that this rash is caused by the Lotrisone cream.  Difficult to ascertain the cause as patient does not have any other identifying triggers.  Decadron 10 mg IM was administered.  Prednisone 50 mg for 5 days was prescribed, triamcinolone cream 0.1% was prescribed to help with itching, and patient was prescribed famotidine 20 mg to act as an antagonist of the rash.  Patient is currently taking Zyrtec and Benadryl.  He was advised to continue.  Supportive care recommendations were provided and discussed with patient along with indications of when follow-up will be  necessary.  Patient is in agreement with this plan of care and verbalizes understanding.  All questions were answered.  Patient stable for discharge.   Final Clinical Impressions(s) / UC Diagnoses   Final diagnoses:  Rash and nonspecific skin eruption     Discharge Instructions  Take medication as prescribed. May also take over-the-counter Zyrtec to help with itching. Avoid hot baths or showers while symptoms persist.  Recommend taking lukewarm baths. May apply cool cloths to the area to help with itching or discomfort. Avoid scratching, rubbing, or manipulating the areas while symptoms persist. Recommend Aveeno Colloidal Oatmeal Bath to use to help with drying and itching. If symptoms worsen, or fail to improve, please follow-up in this clinic or with your primary care physician for further evaluation. Follow-up as needed.     ED Prescriptions     Medication Sig Dispense Auth. Provider   triamcinolone cream (KENALOG) 0.1 % Apply 1 Application topically 2 (two) times daily. 80 g Jeanie Mccard-Warren, Sadie Haber, NP   predniSONE (DELTASONE) 50 MG tablet Take 1 tablet daily with breakfast for the next 5 days. 5 tablet Ayisha Pol-Warren, Sadie Haber, NP   famotidine (PEPCID) 20 MG tablet Take 1 tablet (20 mg total) by mouth 2 (two) times daily. 30 tablet Aradhana Gin-Warren, Sadie Haber, NP      PDMP not reviewed this encounter.   Abran Cantor, NP 11/28/22 [phone]

## 2022-11-28 NOTE — ED Triage Notes (Signed)
Was seen here for rash on 4/4 and was given an ointment to use.  States he started to get bumps under arms, down arms and on legs since 4/7.  States he continued to use the ointment because it was helping the rash on foot.  States rash itches.  States rash is different than what is on his foot.

## 2022-11-28 NOTE — Discharge Instructions (Addendum)
Take medication as prescribed. May also take over-the-counter Zyrtec to help with itching. Avoid hot baths or showers while symptoms persist.  Recommend taking lukewarm baths. May apply cool cloths to the area to help with itching or discomfort. Avoid scratching, rubbing, or manipulating the areas while symptoms persist. Recommend Aveeno Colloidal Oatmeal Bath to use to help with drying and itching. If symptoms worsen, or fail to improve, please follow-up in this clinic or with your primary care physician for further evaluation. Follow-up as needed.

## 2023-03-13 DIAGNOSIS — H6091 Unspecified otitis externa, right ear: Secondary | ICD-10-CM | POA: Diagnosis not present

## 2023-06-29 ENCOUNTER — Encounter (HOSPITAL_COMMUNITY): Payer: Self-pay

## 2023-06-29 ENCOUNTER — Other Ambulatory Visit: Payer: Self-pay

## 2023-06-29 ENCOUNTER — Emergency Department (HOSPITAL_COMMUNITY)
Admission: EM | Admit: 2023-06-29 | Discharge: 2023-06-29 | Disposition: A | Discharge status: Home / Self Care | Payer: BC Managed Care – PPO | Attending: Emergency Medicine | Admitting: Emergency Medicine

## 2023-06-29 DIAGNOSIS — Y9367 Activity, basketball: Secondary | ICD-10-CM | POA: Diagnosis not present

## 2023-06-29 DIAGNOSIS — Y92310 Basketball court as the place of occurrence of the external cause: Secondary | ICD-10-CM | POA: Insufficient documentation

## 2023-06-29 DIAGNOSIS — S86112A Strain of other muscle(s) and tendon(s) of posterior muscle group at lower leg level, left leg, initial encounter: Secondary | ICD-10-CM | POA: Diagnosis not present

## 2023-06-29 DIAGNOSIS — X58XXXA Exposure to other specified factors, initial encounter: Secondary | ICD-10-CM | POA: Diagnosis not present

## 2023-06-29 DIAGNOSIS — S86111A Strain of other muscle(s) and tendon(s) of posterior muscle group at lower leg level, right leg, initial encounter: Secondary | ICD-10-CM | POA: Diagnosis not present

## 2023-06-29 DIAGNOSIS — S8991XA Unspecified injury of right lower leg, initial encounter: Secondary | ICD-10-CM | POA: Diagnosis not present

## 2023-06-29 MED ORDER — KETOROLAC TROMETHAMINE 10 MG PO TABS
10.0000 mg | ORAL_TABLET | Freq: Once | ORAL | Status: AC
  Administered 2023-06-29: 10 mg via ORAL
  Filled 2023-06-29: qty 1

## 2023-06-29 NOTE — ED Provider Notes (Signed)
[place] [facility] AT [facility] Provider Note   CSN: [id number] Arrival date & time: 06/29/23  1959     History  Chief Complaint  Patient presents with   Leg Injury    Andrew Carroll is a 37 y.o. male, who Presents the ER with left calf pain swelling and bruising.  He was playing basketball and, pushing off of his left foot and felt a pop in his left medial calf and has had bruising and swelling since then.   HPI     Home Medications Prior to Admission medications   Medication Sig Start Date End Date Taking? Authorizing Provider  Cetirizine HCl 10 MG CAPS Take 1 capsule (10 mg total) by mouth daily for 10 days. 03/21/18 03/31/18  Wieters, Hallie C, PA-C  clotrimazole-betamethasone (LOTRISONE) cream Apply to affected area 2 times daily until symptoms improve. 11/14/22   Leath-Warren, Sadie Haber, NP  famotidine (PEPCID) 20 MG tablet Take 1 tablet (20 mg total) by mouth 2 (two) times daily. 11/28/22   Leath-Warren, Sadie Haber, NP  fluticasone (FLONASE) 50 MCG/ACT nasal spray Place 1-2 sprays into both nostrils daily. 03/21/18   Wieters, Hallie C, PA-C  ipratropium (ATROVENT) 0.03 % nasal spray Place 2 sprays into both nostrils every 12 (twelve) hours. 04/01/22   Roxy Horseman, PA-C  predniSONE (DELTASONE) 50 MG tablet Take 1 tablet daily with breakfast for the next 5 days. 11/28/22   Leath-Warren, Sadie Haber, NP  triamcinolone cream (KENALOG) 0.1 % Apply 1 Application topically 2 (two) times daily. 11/28/22   Leath-Warren, Sadie Haber, NP      Allergies    Patient has no known allergies.    Review of Systems   Review of Systems  Physical Exam Updated Vital Signs BP 122/86   Pulse (!) 109   Temp 100 F (37.8 C) (Oral)   Resp 17   Ht 5\' 6"  (1.676 m)   Wt 98.9 kg   SpO2 99%   BMI 35.19 kg/m  Physical Exam Vitals and nursing note reviewed.  Constitutional:      General: He is not in acute distress.    Appearance: He is well-developed.  HENT:      Head: Normocephalic and atraumatic.     Mouth/Throat:     Mouth: Mucous membranes are moist.  Eyes:     Extraocular Movements: Extraocular movements intact.     Conjunctiva/sclera: Conjunctivae normal.     Pupils: Pupils are equal, round, and reactive to light.  Cardiovascular:     Rate and Rhythm: Normal rate and regular rhythm.     Heart sounds: No murmur heard. Pulmonary:     Effort: Pulmonary effort is normal. No respiratory distress.     Breath sounds: Normal breath sounds.  Abdominal:     Palpations: Abdomen is soft.     Tenderness: There is no abdominal tenderness.  Musculoskeletal:        General: Swelling present.     Cervical back: Neck supple.     Comments: Mild swelling with ecchymosis to the medial calf of the left leg.  There is intact distal pulses, foot is well perfused and warm.  Compartments are supple.  Patient can flex and extend the ankle and knee without difficulty.  There is no bony tenderness.  No tenderness over the Achilles tendon.  Patient has normal ankle plantarflexion on calf squeeze.  Skin:    General: Skin is warm and dry.     Capillary Refill: Capillary refill takes  less than 2 seconds.  Neurological:     General: No focal deficit present.     Mental Status: He is alert and oriented to person, place, and time.  Psychiatric:        Mood and Affect: Mood normal.     ED Results / Procedures / Treatments   Labs (all labs ordered are listed, but only abnormal results are displayed) Labs Reviewed - No data to display  EKG None  Radiology No results found.  Procedures Procedures    Medications Ordered in ED Medications  ketorolac (TORADOL) tablet 10 mg (10 mg Oral Given 06/29/23 2111)    ED Course/ Medical Decision Making/ A&P                                 Medical Decision Making DDx: Fracture, dislocation, sprain, strain, contusion, other  Course: Patient with left calf pain after an acute pop in the calf while accelerating  playing basketball.  He has tenderness with some bruising and mild swelling over the medial calf on the left leg.  Symptoms are consistent with muscle strain versus a partial tear.  He has normal range of motion of the ankle and foot, normal perfusion in his foot.  There are supple compartments not consistent with a compartment syndrome at this time.  He is not have any numbness or tingling.  Will treat with ice, Ace wrap, crutches and orthopedic follow-up.  Given strict return precautions.  He has no bony tenderness.  There is no indication for any imaging at this time.  Risk Prescription drug management.           Final Clinical Impression(s) / ED Diagnoses Final diagnoses:  Gastrocnemius strain, right, initial encounter    Rx / DC Orders ED Discharge Orders     None         Josem Kaufmann 06/29/23 2112    Eber Hong, MD 06/30/23 [phone]

## 2023-06-29 NOTE — ED Triage Notes (Signed)
Pt was playing basketball just prior to arrival and felt a "pop" in his left leg, calf area. Not able to bear weight, area red and significant swelling. Icing area on arrival.

## 2023-06-29 NOTE — Discharge Instructions (Signed)
Presented to the ER today with an injury to your left calf.  Likely you have muscle strain versus tear.  Use an Ace wrap, crutches, anti-inflammatories and follow-up with orthopedic doctor.  Come back to the ER if you have severe swelling, severe pain, numbness or tingling or other symptoms.

## 2023-07-01 ENCOUNTER — Encounter: Payer: Self-pay | Admitting: Orthopedic Surgery

## 2023-07-01 ENCOUNTER — Ambulatory Visit: Payer: BC Managed Care – PPO | Admitting: Orthopedic Surgery

## 2023-07-01 VITALS — BP 95/61 | HR 77 | Ht 66.0 in | Wt 218.0 lb

## 2023-07-01 DIAGNOSIS — S86112A Strain of other muscle(s) and tendon(s) of posterior muscle group at lower leg level, left leg, initial encounter: Secondary | ICD-10-CM

## 2023-07-01 NOTE — Patient Instructions (Signed)
Please provide a note for work - light duty only until the next visit in 2 weeks

## 2023-07-02 ENCOUNTER — Telehealth: Payer: Self-pay | Admitting: Orthopedic Surgery

## 2023-07-02 ENCOUNTER — Encounter: Payer: Self-pay | Admitting: Orthopedic Surgery

## 2023-07-02 MED ORDER — PREDNISONE 10 MG (21) PO TBPK: ORAL_TABLET | ORAL | 0 refills | Status: DC

## 2023-07-02 NOTE — Progress Notes (Signed)
New Patient Visit  Assessment: Andrew Carroll is a 37 y.o. male with the following: 1. Gastrocnemius muscle tear, left, initial encounter  Plan: Hubbard Hartshorn sustained a gastrocnemius muscle tear.  Achilles tendon is intact, in full continuity.  Swelling and bruising has improved since the injury.  His pain is getting better.  Ashby Dawes of the injury was discussed.  Okay for him to transition to a regular shoe, with weightbearing as tolerated.  Recommended that he initiate weightbearing using the crutches.  I do think he will continue to improve, and ultimately feel better rather quickly.  Okay for him to work on range of motion, but let pain be his guide.  I have recommended a short course of prednisone, to help with the pain and swelling.  I would like see him back in 2 weeks, to ensure that he continues to get better.  Follow-up: Return in about 2 weeks (around 07/15/2023).  Subjective:  Chief Complaint  Patient presents with   Leg Injury    L Calf/ DOI 06/1723 Playing basketball, when he pushed off to throw the ball he felt a pop. Went to the ER.    History of Present Illness: EMERIC Carroll is a 37 y.o. male who presents for evaluation of left calf pain.  He states has been basketball couple of days ago.  He pushed off of his left leg to take shot, and felt a pop.  He had immediate pain.  He noticed a lot of swelling and bruising.  He presented to the emergency department.  He has been wearing an Ace wrap on the leg, and using crutches.  He has not been bearing weight.  Pain continues to get better.  He has not been able to return to his job.   Review of Systems: No fevers or chills No numbness or tingling No chest pain No shortness of breath No bowel or bladder dysfunction No GI distress No headaches   Medical History:  No past medical history on file.  No past surgical history on file.  Family History  Problem Relation Age of Onset   Healthy Mother    Healthy  Father    Social History   Tobacco Use   Smoking status: Former   Smokeless tobacco: Never  Advertising account planner   Vaping status: Never Used  Substance Use Topics   Alcohol use: Not Currently   Drug use: No    No Known Allergies  Current Meds  Medication Sig   predniSONE (STERAPRED UNI-PAK 21 TAB) 10 MG (21) TBPK tablet 10 mg DS 12 as directed    Objective: BP 95/61   Pulse 77   Ht 5\' 6"  (1.676 m)   Wt 218 lb (98.9 kg)   BMI 35.19 kg/m   Physical Exam:  General: Alert and oriented. and No acute distress. Gait: Ambulates with the assistance of crutches.  Evaluation of the left lower leg demonstrates diffuse swelling within the calf musculature.  He does have some tenderness to palpation.  The Achilles tendon is palpable, without discomfort.  He is able to plantarflex the ankle.  Sensation intact over the dorsum of the foot.  Toes warm and well-perfused.  No tenderness into the posterior aspect of the knee.  IMAGING: I personally reviewed images previously obtained from the ED  X-rays from the emergency department are negative for acute injury.   New Medications:  Meds ordered this encounter  Medications   predniSONE (STERAPRED UNI-PAK 21 TAB) 10 MG (21) TBPK  tablet    Sig: 10 mg DS 12 as directed    Dispense:  48 tablet    Refill:  0      Oliver Barre, MD  07/02/2023 1:42 PM

## 2023-07-02 NOTE — Telephone Encounter (Signed)
Dr. Dallas Schimke pt - spoke w/the pt's spouse, she stated that Dr. Dallas Schimke was going to call in a steroid for the pt yesterday and the pharmacy is stating they do not have it.  [facility] on [address].

## 2023-07-07 ENCOUNTER — Telehealth: Payer: Self-pay | Admitting: Orthopedic Surgery

## 2023-07-07 MED ORDER — TRAMADOL HCL 50 MG PO TABS: 50.0000 mg | ORAL_TABLET | Freq: Four times a day (QID) | ORAL | 0 refills | Status: DC | PRN

## 2023-07-07 NOTE — Telephone Encounter (Signed)
DR. Dallas Schimke  Patient spouse called and advised [place] the Tylenol is not helping with his pain.  On a level of 1 to 10 what is his pain level she said 5 or 6 she is not sure she is not home with him.  She states it hurting just sitting and walking, it started on Friday and all weekend.   She is requesting some pain medicine for him.   Pharmacy:  [facility] on [address]

## 2023-07-07 NOTE — Telephone Encounter (Signed)
She is not on his HIPAA I can't give her any information, but I did call to ask her if he is taking the Steroid taper and she states he is  I told her I would send message to Dr Dallas Schimke

## 2023-07-15 ENCOUNTER — Encounter: Payer: Self-pay | Admitting: Orthopedic Surgery

## 2023-07-15 ENCOUNTER — Ambulatory Visit (INDEPENDENT_AMBULATORY_CARE_PROVIDER_SITE_OTHER): Payer: BC Managed Care – PPO | Admitting: Orthopedic Surgery

## 2023-07-15 DIAGNOSIS — S86112D Strain of other muscle(s) and tendon(s) of posterior muscle group at lower leg level, left leg, subsequent encounter: Secondary | ICD-10-CM | POA: Diagnosis not present

## 2023-07-15 NOTE — Progress Notes (Signed)
Return patient Visit  Assessment: Andrew Carroll is a 37 y.o. male with the following: 1. Gastrocnemius muscle tear, left, subsequent encounter  Plan: Hubbard Hartshorn continues to have pain and swelling in the left gastrocnemius muscle belly.  There is some mild bruising.  He continues to struggle, especially at work.  As such, I have recommended a walking boot.  He is fitted in clinic today, however, both boots put too much pressure on his muscle.  As a result, he would just continue with compression as needed.Marland Kitchen  He can continue with weightbearing as tolerated.  Gentle motion it is encouraged.  He will finish his prednisone, and then take Tylenol and or ibuprofen.  Tramadol as needed.  Continue to monitor him closely.  I would like to see him back in 2 weeks.  If he continues to struggle, I will recommend physical therapy.  Follow-up: No follow-ups on file.  Subjective:  Chief Complaint  Patient presents with   Leg Pain    L Calf/ DOI 06/29/23    History of Present Illness: TRYONE STILLS is a 37 y.o. male who turns with chief complaint of left calf pain.  He injured his left calf Playing basketball, approximately 2 weeks ago.  He continues to have pain.  He is doing better.  He struggles after a day at work.  Occasionally, he will have to use crutches when at home.  He has been taking prednisone, but is due to finish this today.  Otherwise, he is taking tramadol.  Review of Systems: No fevers or chills No numbness or tingling No chest pain No shortness of breath No bowel or bladder dysfunction No GI distress No headaches   Objective: There were no vitals taken for this visit.  Physical Exam:  General: Alert and oriented. and No acute distress. Gait: Left sided antalgic gait.  Gastroc remains swollen.  Bruising is appreciated.  He has tenderness to palpation, especially within the proximal muscle belly.  Achilles is intact.  No obvious defect in the proximal  attachment.  Toes warm well-perfused.  IMAGING: I personally reviewed images previously obtained from the ED  X-rays from the emergency department are negative for acute injury.   New Medications:  No orders of the defined types were placed in this encounter.     Oliver Barre, MD  07/15/2023 10:19 AM

## 2023-07-17 ENCOUNTER — Telehealth: Payer: Self-pay | Admitting: Orthopedic Surgery

## 2023-07-17 MED ORDER — TRAMADOL HCL 50 MG PO TABS: 50.0000 mg | ORAL_TABLET | Freq: Four times a day (QID) | ORAL | 0 refills | Status: DC | PRN

## 2023-07-17 NOTE — Telephone Encounter (Signed)
Dr. Dallas Schimke pt - pt's spouse Aundra Millet [phone] lvm requesting a refill for Tramadol 50mg , 30 tablets, every 6 hours PRN to be sent to [facility] on [address]

## 2023-07-29 ENCOUNTER — Encounter: Payer: Self-pay | Admitting: Orthopedic Surgery

## 2023-07-29 ENCOUNTER — Ambulatory Visit (INDEPENDENT_AMBULATORY_CARE_PROVIDER_SITE_OTHER): Payer: BC Managed Care – PPO | Admitting: Orthopedic Surgery

## 2023-07-29 DIAGNOSIS — S86112D Strain of other muscle(s) and tendon(s) of posterior muscle group at lower leg level, left leg, subsequent encounter: Secondary | ICD-10-CM

## 2023-07-29 NOTE — Progress Notes (Signed)
Return patient Visit  Assessment: Andrew Carroll is a 37 y.o. male with the following: 1. Gastrocnemius muscle tear, left, subsequent encounter  Plan: LYNFORD SHYTLE is doing much better.  He has returned to full activities at work.  He is no longer taking medicines.  This may linger for a while.  Anticipate continued improvement.  Continue to use the compression sleeve.  Medicines as needed.  If he has any issues, I would recommend physical therapy.  He states understanding.  Follow-up as needed.  Follow-up: Return if symptoms worsen or fail to improve.  Subjective:  Chief Complaint  Patient presents with   Leg Pain    L calf pain feeling better    History of Present Illness: Andrew Carroll is a 37 y.o. male who returns with chief complaint of left calf pain.  He injured his left calf playing basketball several weeks ago.  He is doing much better.  He is wearing a regular shoe.  He returned to full duties at work yesterday.  Occasional pains.  He is no longer taking any medicines.  He is wearing compression sleeve on his left leg.   Review of Systems: No fevers or chills No numbness or tingling No chest pain No shortness of breath No bowel or bladder dysfunction No GI distress No headaches   Objective: There were no vitals taken for this visit.  Physical Exam:  General: Alert and oriented. and No acute distress. Gait: Nonantalgic gait.  Mild swelling of the gastrocnemius.  No tenderness to palpation.  Bruising has improved.  He has good plantarflexion strength.  Overall, he looks much better.  IMAGING: I personally reviewed images previously obtained from the ED  X-rays from the emergency department are negative for acute injury.   New Medications:  No orders of the defined types were placed in this encounter.     Oliver Barre, MD  07/29/2023 9:00 AM

## 2023-11-29 ENCOUNTER — Telehealth: Admitting: Nurse Practitioner

## 2023-11-29 DIAGNOSIS — B9789 Other viral agents as the cause of diseases classified elsewhere: Secondary | ICD-10-CM | POA: Diagnosis not present

## 2023-11-29 DIAGNOSIS — J329 Chronic sinusitis, unspecified: Secondary | ICD-10-CM | POA: Diagnosis not present

## 2023-11-29 MED ORDER — IPRATROPIUM BROMIDE 0.03 % NA SOLN: 2.0000 | Freq: Two times a day (BID) | NASAL | 0 refills | Status: DC

## 2023-11-29 NOTE — Progress Notes (Signed)
 I have spent 5 minutes in review of e-visit questionnaire, review and updating patient chart, medical decision making and response to patient.   Claiborne Rigg, NP

## 2023-11-29 NOTE — Progress Notes (Signed)
 E-Visit for Sinus Problems  We are sorry that you are not feeling well.  Here is how we plan to help!  Providers prescribe antibiotics to treat infections caused by bacteria. Antibiotics are very powerful in treating bacterial infections when they are used properly. To maintain their effectiveness, they should be used only when necessary. Overuse of antibiotics has resulted in the development of superbugs that are resistant to treatment.   After careful review of your answers, I would not recommend an antibiotic for your condition.  Antibiotics are not effective against viruses and therefore should not be used to treat them. Common examples of infections caused by viruses include colds and flu  If you are not feeling better in 7-10 days please feel free to reach back out to us  for possible antibiotic prescription at that time.   Based on what you have shared with me it looks like you have sinusitis.  Sinusitis is inflammation and infection in the sinus cavities of the head.  Based on your presentation I believe you most likely have Acute Viral Sinusitis.This is an infection most likely caused by a virus. There is not specific treatment for viral sinusitis other than to help you with the symptoms until the infection runs its course.  You may use an oral decongestant such as Mucinex D or if you have glaucoma or high blood pressure use plain Mucinex. Saline nasal spray help and can safely be used as often as needed for congestion, I have prescribed: Ipratropium Bromide  nasal spray 0.03% 2 sprays in eah nostril 2-3 times a day  Some authorities believe that zinc sprays or the use of Echinacea may shorten the course of your symptoms.  Sinus infections are not as easily transmitted as other respiratory infection, however we still recommend that you avoid close contact with loved ones, especially the very young and elderly.  Remember to wash your hands thoroughly throughout the day as this is the number one  way to prevent the spread of infection!  Home Care: Only take medications as instructed by your medical team. Do not take these medications with alcohol. A steam or ultrasonic humidifier can help congestion.  You can place a towel over your head and breathe in the steam from hot water  coming from a faucet. Avoid close contacts especially the very young and the elderly. Cover your mouth when you cough or sneeze. Always remember to wash your hands.  Get Help Right Away If: You develop worsening fever or sinus pain. You develop a severe head ache or visual changes. Your symptoms persist after you have completed your treatment plan.  Make sure you Understand these instructions. Will watch your condition. Will get help right away if you are not doing well or get worse.   Thank you for choosing an e-visit.  Your e-visit answers were reviewed by a board certified advanced clinical practitioner to complete your personal care plan. Depending upon the condition, your plan could have included both over the counter or prescription medications.  Please review your pharmacy choice. Make sure the pharmacy is open so you can pick up prescription now. If there is a problem, you may contact your provider through [employer] and have the prescription routed to another pharmacy.  Your safety is important to us . If you have drug allergies check your prescription carefully.   For the next 24 hours you can use MyChart to ask questions about today's visit, request a non-urgent call back, or ask for a work or school  excuse. You will get an email in the next two days asking about your experience. I hope that your e-visit has been valuable and will speed your recovery.

## 2023-12-04 ENCOUNTER — Telehealth: Admitting: Physician Assistant

## 2023-12-04 DIAGNOSIS — B9689 Other specified bacterial agents as the cause of diseases classified elsewhere: Secondary | ICD-10-CM | POA: Diagnosis not present

## 2023-12-04 DIAGNOSIS — J019 Acute sinusitis, unspecified: Secondary | ICD-10-CM | POA: Diagnosis not present

## 2023-12-04 MED ORDER — AMOXICILLIN-POT CLAVULANATE 875-125 MG PO TABS: 1.0000 | ORAL_TABLET | Freq: Two times a day (BID) | ORAL | 0 refills | Status: DC

## 2023-12-04 NOTE — Progress Notes (Signed)
 I have spent 5 minutes in review of e-visit questionnaire, review and updating patient chart, medical decision making and response to patient.   Piedad Climes, PA-C

## 2023-12-04 NOTE — Progress Notes (Signed)

## 2024-04-05 ENCOUNTER — Other Ambulatory Visit (HOSPITAL_BASED_OUTPATIENT_CLINIC_OR_DEPARTMENT_OTHER): Payer: Self-pay | Admitting: Physician Assistant

## 2024-04-05 ENCOUNTER — Ambulatory Visit (INDEPENDENT_AMBULATORY_CARE_PROVIDER_SITE_OTHER)

## 2024-04-05 ENCOUNTER — Encounter (HOSPITAL_BASED_OUTPATIENT_CLINIC_OR_DEPARTMENT_OTHER): Payer: Self-pay | Admitting: Physician Assistant

## 2024-04-05 ENCOUNTER — Ambulatory Visit (INDEPENDENT_AMBULATORY_CARE_PROVIDER_SITE_OTHER): Admitting: Physician Assistant

## 2024-04-05 DIAGNOSIS — S39012A Strain of muscle, fascia and tendon of lower back, initial encounter: Secondary | ICD-10-CM | POA: Diagnosis not present

## 2024-04-05 DIAGNOSIS — M545 Low back pain, unspecified: Secondary | ICD-10-CM

## 2024-04-05 MED ORDER — METHOCARBAMOL 500 MG PO TABS: 500.0000 mg | ORAL_TABLET | Freq: Four times a day (QID) | ORAL | 0 refills | Status: DC | PRN

## 2024-04-05 MED ORDER — PREDNISONE 10 MG (21) PO TBPK: ORAL_TABLET | ORAL | 0 refills | Status: DC

## 2024-04-05 NOTE — Progress Notes (Signed)
 Office Visit Note   Patient: Andrew Carroll           Date of Birth: 07/16/1986           MRN: [id number] Visit Date: 04/05/2024              Requested by: No referring provider defined for this encounter. PCP: Patient, No Pcp Per   Assessment & Plan: Visit Diagnoses:  1. Strain of lumbar region, initial encounter   2. Acute bilateral low back pain without sciatica     Plan: 38 year old male with two day history of low back pain, primarily left sided, after stretching motion during basketball yesterday evening. No red flag symptoms; saddle anesthesia, bowel/bladder incontinence, urinary retention, lower extremity weakness/paresthesias. Benign physical exam. Benign lumbar spine xray. Suspect muscle strain. Will prescribe prednisone  and robaxin . Advised ice, heat, rest, stretching. Patient works in a supervisory role, declined work excuse note. Advised patient to return in 1-2 weeks if no significant improvement, sooner with any worsening symptoms.  Follow-Up Instructions: In 1-2 weeks if no significant improvement, sooner with any issues/concerns  Orders:  No orders of the defined types were placed in this encounter.    Procedures: No procedures performed   Clinical Data: No additional findings.   Subjective: Chief Complaint  Patient presents with   Lower Back - Pain    HPI  37 year old male presents to [facility] with two day history of low back pain. Began yesterday evening. Patient was playing a game of basketball at church with his bible study group. Went to block a shot, reached upward, had sudden onset clenching pain in left lower back. Had to stop playing. Took previously prescribed Tramadol  and applied IcyHot last night with minimal improvement. States pain today is slightly more severe. Describes as sharp pain. Located across bilateral lower back, left worse than right. Denies radiation of pain. Denies lower extremity weakness/paresthesias. Denies saddle anesthesia  or bowel/bladder incontinence. Denies previous history of low back injury/issues.  Review of Systems  All other systems reviewed and are negative.   Objective: Vital Signs: There were no vitals taken for this visit.  Physical Exam Constitutional:      Appearance: Normal appearance.  Pulmonary:     Effort: Pulmonary effort is normal.  Neurological:     General: No focal deficit present.     Mental Status: He is alert and oriented to person, place, and time.  Psychiatric:        Mood and Affect: Mood normal.        Behavior: Behavior normal.     Ortho Exam  Patient sitting in examination chair. Leaning to right side. Antalgic gait in slightly trunk flexed position.  Back normal to inspection. No erythema, edema or ecchymosis. No tenderness with palpation over midline spine. No palpable stepoff, deformity, or crepitus along spinous processes. No right sided paraspinal musculature tenderness or tightness. Left lower lumbar spinal musculature with very minimal tenderness over L5 area. No palpable muscle tightness. Full ROM; minimal pain with extension and right rotation. Negative straight leg raise bilaterally. 5/5 lower extremity muscle strength. Sensation intact. Brisk capillary refill.  Specialty Comments:  No specialty comments available.  Imaging: Lumbar spine xray reveals no fractures or other bony abnormalities. The vertebral bodies and discs are of normal height. Minimal scoliosis. The pedicles, transverse processes and spinous processes have a normal appearance. Possible minimal facet arthritic changes. The visualized ribs are normal.    PMFS History: There are  no active problems to display for this patient.  History reviewed. No pertinent past medical history.  Family History  Problem Relation Age of Onset   Healthy Mother    Healthy Father     History reviewed. No pertinent surgical history. Social History   Occupational History   Not on file  Tobacco Use    Smoking status: Former   Smokeless tobacco: Never  Vaping Use   Vaping status: Never Used  Substance and Sexual Activity   Alcohol use: Not Currently   Drug use: No   Sexual activity: Not on file

## 2024-06-14 ENCOUNTER — Encounter: Payer: Self-pay | Admitting: Radiology

## 2024-07-26 ENCOUNTER — Ambulatory Visit
Admission: RE | Admit: 2024-07-26 | Discharge: 2024-07-26 | Disposition: A | Discharge status: Home / Self Care | Source: Ambulatory Visit

## 2024-07-26 VITALS — BP 156/80 | HR 86 | Temp 98.1°F | Resp 17

## 2024-07-26 DIAGNOSIS — H209 Unspecified iridocyclitis: Secondary | ICD-10-CM | POA: Diagnosis not present

## 2024-07-26 MED ORDER — NAPHAZOLINE-PHENIRAMINE 0.025-0.3 % OP SOLN: 1.0000 [drp] | OPHTHALMIC | 0 refills | Status: DC | PRN

## 2024-07-26 MED ORDER — ERYTHROMYCIN 5 MG/GM OP OINT: TOPICAL_OINTMENT | OPHTHALMIC | 0 refills | Status: DC

## 2024-07-26 NOTE — ED Triage Notes (Signed)
 Pt c/o bilateral eye itching, burning, and drainage during the morning for about 1 week. States his vision is blurry after look at laptop.

## 2024-07-26 NOTE — Discharge Instructions (Addendum)
 You were seen today for ongoing irritation of both eyes, including burning, itching, light sensitivity, and watery drainage. Your vision and eye movements are normal, and there are no signs of a serious eye injury at this time. Your symptoms may be related to eye inflammation, which can have several causes and often improves with treatment and close monitoring.  Use the erythromycin  eye ointment as prescribed. Pull down the lower eyelid and apply a thin ribbon of ointment from the inner to outer corner of the eye, then blink to spread it. Your vision may feel temporarily blurry or greasy after applying the ointment, which is expected. You may use Naphcon-A eye drops every four hours as needed for irritation, but wait at least one hour after using the ointment before applying the drops. Avoid rubbing your eyes, wash your hands frequently, and limit screen time when possible. Taking regular breaks from screens, using artificial tears, and wearing sunglasses in bright light may help reduce discomfort.  Follow up with an ophthalmologist if your symptoms are not improving or if they worsen over the next week. Seek emergency care right away if you develop increasing eye pain, significant vision changes, worsening light sensitivity, new redness, eye swelling, or if symptoms rapidly worsen.

## 2024-07-26 NOTE — ED Provider Notes (Signed)
 [facility]    CSN: [id number] Arrival date & time: 07/26/24  1315      History   Chief Complaint Chief Complaint  Patient presents with   Eye Problem    Eyes are hurting and have a small amount of stuff on them in the morning. Also have blurry vision after using my laptop at work - Entered by patient    HPI Andrew Carroll is a 38 y.o. male.   Discussed the use of AI scribe software for clinical note transcription with the patient, who gave verbal consent to proceed.   The patient presents with bilateral eye redness, burning, irritation, and drainage for approximately one week. Symptoms have persisted without improvement. He describes the drainage as watery and colorless and notes mild crusting around the eyes upon waking, without significant matting. He reports a combination of pain, itching, and burning, particularly when closing his eyes, along with a constant pressure sensation. About one week ago, the left eye initially appeared more red, described as resembling a prominent blood vessel, which has since resolved; the right eye has not had noticeable redness. He reports light sensitivity, most notable a few days ago while driving, and transient blurred vision after prolonged laptop use for approximately three hours on two consecutive workdays last week. He denies floaters. He wears glasses at baseline and does not use contact lenses. He denies eye injury, recent upper respiratory symptoms, dizziness, or headaches.  The following sections of the patient's history were reviewed and updated as appropriate: allergies, current medications, past family history, past medical history, past social history, past surgical history, and problem list.     History reviewed. No pertinent past medical history.  There are no active problems to display for this patient.   History reviewed. No pertinent surgical history.     Home Medications    Prior to Admission medications   Medication Sig Start Date End Date Taking? Authorizing Provider  erythromycin  ophthalmic ointment Place 1 cm ribbon into the lower eyelid of both eyes three times a day for 7 days 07/26/24  Yes Sofya Moustafa, FNP  naphazoline-pheniramine (NAPHCON-A) 0.025-0.3 % ophthalmic solution Place 1 drop into both eyes every 4 (four) hours as needed for eye irritation. 07/26/24  Yes Iola Lukes, FNP  Cetirizine  HCl 10 MG CAPS Take 1 capsule (10 mg total) by mouth daily for 10 days. 03/21/18 03/31/18  Wieters, Hallie C, PA-C  famotidine  (PEPCID ) 20 MG tablet Take 1 tablet (20 mg total) by mouth 2 (two) times daily. 11/28/22   Leath-Warren, Etta PARAS, NP  fluticasone  (FLONASE ) 50 MCG/ACT nasal spray Place 1-2 sprays into both nostrils daily. 03/21/18   Wieters, Hallie C, PA-C  ipratropium (ATROVENT ) 0.03 % nasal spray Place 2 sprays into both nostrils every 12 (twelve) hours. 11/29/23   Fleming, Zelda W, NP  methocarbamol  (ROBAXIN ) 500 MG tablet Take 1 tablet (500 mg total) by mouth every 6 (six) hours as needed for muscle spasms. 04/05/24   Steffanie Lukes, PA-C  predniSONE  (STERAPRED UNI-PAK 21 TAB) 10 MG (21) TBPK tablet 10 mg DS 12 as directed 04/05/24   Singer, Archana Eckman, PA-C  traMADol  (ULTRAM ) 50 MG tablet Take 1 tablet (50 mg total) by mouth every 6 (six) hours as needed. 07/17/23   Onesimo Oneil LABOR, MD    Family History Family History  Problem Relation Age of Onset   Healthy Mother    Healthy Father     Social History Social History[1]   Allergies   Patient  has no known allergies.   Review of Systems Review of Systems  Constitutional: Negative.   HENT: Negative.    Eyes:  Positive for photophobia, pain (burning sensation with pain when closing eyes), discharge (watery, some crusting in the mornings but no matting), redness (within the left eye a few days ago but resolved) and visual disturbance (for a couple of hours after looking at laptop consistently at work). Negative for itching.   Respiratory: Negative.    Neurological:  Negative for dizziness and headaches.  All other systems reviewed and are negative.    Physical Exam Triage Vital Signs ED Triage Vitals [07/26/24 1400]  Encounter Vitals Group     BP (!) 156/80     Girls Systolic BP Percentile      Girls Diastolic BP Percentile      Boys Systolic BP Percentile      Boys Diastolic BP Percentile      Pulse Rate 86     Resp 17     Temp 98.1 F (36.7 C)     Temp Source Oral     SpO2 97 %     Weight      Height      Head Circumference      Peak Flow      Pain Score      Pain Loc      Pain Education      Exclude from Growth Chart    No data found.  Updated Vital Signs BP (!) 156/80 (BP Location: Right Arm)   Pulse 86   Temp 98.1 F (36.7 C) (Oral)   Resp 17   SpO2 97%   Visual Acuity Right Eye Distance: 20/20 Left Eye Distance: 20/20 Bilateral Distance: 20/20  Right Eye Near:   Left Eye Near:    Bilateral Near:     Physical Exam Vitals reviewed.  Constitutional:      General: He is awake. He is not in acute distress.    Appearance: Normal appearance. He is well-developed. He is not ill-appearing, toxic-appearing or diaphoretic.  HENT:     Head: Normocephalic.     Right Ear: Hearing normal.     Left Ear: Hearing normal.     Nose: Nose normal.     Mouth/Throat:     Mouth: Mucous membranes are moist.  Eyes:     General: Lids are normal. Lids are everted, no foreign bodies appreciated. Vision grossly intact. Gaze aligned appropriately. No visual field deficit.       Right eye: No foreign body, discharge or hordeolum.        Left eye: No foreign body, discharge or hordeolum.     Extraocular Movements: Extraocular movements intact.     Right eye: Normal extraocular motion and no nystagmus.     Left eye: Normal extraocular motion and no nystagmus.     Conjunctiva/sclera: Conjunctivae normal.     Pupils: Pupils are equal, round, and reactive to light.     Visual Fields: Right eye  visual fields normal and left eye visual fields normal.  Cardiovascular:     Rate and Rhythm: Normal rate and regular rhythm.     Heart sounds: Normal heart sounds.  Pulmonary:     Effort: Pulmonary effort is normal.     Breath sounds: Normal breath sounds and air entry.  Musculoskeletal:        General: Normal range of motion.     Cervical back: Normal range of motion and neck supple.  Lymphadenopathy:     Cervical: No cervical adenopathy.  Skin:    General: Skin is warm and dry.  Neurological:     General: No focal deficit present.     Mental Status: He is alert and oriented to person, place, and time.  Psychiatric:        Speech: Speech normal.        Behavior: Behavior is cooperative.      UC Treatments / Results  Labs (all labs ordered are listed, but only abnormal results are displayed) Labs Reviewed - No data to display  EKG   Radiology No results found.  Procedures Procedures (including critical care time)  Medications Ordered in UC Medications - No data to display  Initial Impression / Assessment and Plan / UC Course  I have reviewed the triage vital signs and the nursing notes.  Pertinent labs & imaging results that were available during my care of the patient were reviewed by me and considered in my medical decision making (see chart for details).    The patient presents with one week of persistent bilateral eye irritation characterized by burning, itching, watery drainage, mild morning crusting, photophobia, and intermittent blurred vision after prolonged screen use. Symptoms have not improved but have not significantly worsened. He denies trauma, contact lens use, systemic symptoms, or visual field changes. Examination shows intact visual acuity with correction to 20/20, pupils equal and reactive, intact extraocular movements without pain, no conjunctival injection, and no visual field deficits, though photophobia is present. The presentation is concerning  for a possible inflammatory process such as uveitis, which may be infectious or noninfectious in etiology, though no acute vision-threatening findings are identified at this time.  Empiric treatment was initiated with erythromycin  ophthalmic ointment for ocular surface coverage and Naphcon-A drops as needed for symptomatic relief, with instructions to space drops appropriately from ointment use. The patient was advised to monitor symptoms closely and to follow up with ophthalmology if symptoms do not improve or worsen within one week. He was instructed to seek immediate emergency evaluation for worsening eye pain, significant decline in vision, increasing photophobia, or any acute change in symptoms.  Today's evaluation has revealed no signs of a dangerous process. Discussed diagnosis with patient and/or guardian. Patient and/or guardian aware of their diagnosis, possible red flag symptoms to watch out for and need for close follow up. Patient and/or guardian understands verbal and written discharge instructions. Patient and/or guardian comfortable with plan and disposition.  Patient and/or guardian has a clear mental status at this time, good insight into illness (after discussion and teaching) and has clear judgment to make decisions regarding their care  Documentation was completed with the aid of voice recognition software. Transcription may contain typographical errors.   Final Clinical Impressions(s) / UC Diagnoses   Final diagnoses:  Uveitis of both eyes     Discharge Instructions      You were seen today for ongoing irritation of both eyes, including burning, itching, light sensitivity, and watery drainage. Your vision and eye movements are normal, and there are no signs of a serious eye injury at this time. Your symptoms may be related to eye inflammation, which can have several causes and often improves with treatment and close monitoring.  Use the erythromycin  eye ointment as  prescribed. Pull down the lower eyelid and apply a thin ribbon of ointment from the inner to outer corner of the eye, then blink to spread it. Your vision may feel temporarily blurry or greasy  after applying the ointment, which is expected. You may use Naphcon-A eye drops every four hours as needed for irritation, but wait at least one hour after using the ointment before applying the drops. Avoid rubbing your eyes, wash your hands frequently, and limit screen time when possible. Taking regular breaks from screens, using artificial tears, and wearing sunglasses in bright light may help reduce discomfort.  Follow up with an ophthalmologist if your symptoms are not improving or if they worsen over the next week. Seek emergency care right away if you develop increasing eye pain, significant vision changes, worsening light sensitivity, new redness, eye swelling, or if symptoms rapidly worsen.     ED Prescriptions     Medication Sig Dispense Auth. Provider   erythromycin  ophthalmic ointment Place 1 cm ribbon into the lower eyelid of both eyes three times a day for 7 days 3.5 g Lestat Golob, FNP   naphazoline-pheniramine (NAPHCON-A) 0.025-0.3 % ophthalmic solution Place 1 drop into both eyes every 4 (four) hours as needed for eye irritation. 15 mL Iola Lukes, FNP      PDMP not reviewed this encounter.      [1]  Social History Tobacco Use   Smoking status: Former   Smokeless tobacco: Never  Vaping Use   Vaping status: Never Used  Substance Use Topics   Alcohol use: Not Currently   Drug use: No     Iola Lukes, FNP 07/26/24 1624

## 2024-08-10 ENCOUNTER — Encounter: Payer: Self-pay | Admitting: Podiatry

## 2024-08-10 ENCOUNTER — Ambulatory Visit: Admitting: Podiatry

## 2024-08-10 DIAGNOSIS — B353 Tinea pedis: Secondary | ICD-10-CM

## 2024-08-10 MED ORDER — BETAMETHASONE DIPROPIONATE 0.05 % EX CREA: TOPICAL_CREAM | CUTANEOUS | 1 refills | Status: AC

## 2024-08-10 NOTE — Progress Notes (Signed)
"  °  Subjective:  Patient ID: Andrew Carroll, male    DOB: [birth year],  MRN: [id number] HPI Chief Complaint  Patient presents with   Skin Problem    Dorsal forefoot left - rash, redness, itching x  1 year intermittently, its been continuous x 3 months, Urgent Care Rx'd triamcinolone  cream-notice it itches more and drains when not scabbed over   New Patient (Initial Visit)    38 y.o. male presents with the above complaint.   ROS: Denies fever chills nausea vomiting muscle aches pains calf pain back pain chest pain shortness of breath.  No past medical history on file. No past surgical history on file. Current Medications[1]  Allergies[2] Review of Systems Objective:  There were no vitals filed for this visit.  General: Well developed, nourished, in no acute distress, alert and oriented x3   Dermatological: Skin is warm, dry and supple bilateral. Nails x 10 are well maintained; remaining integument appears unremarkable at this time. There are no open sores, no preulcerative lesions, no rash or signs of infection present.  Redictate lesion measuring approximately 2 cm in diameter at the base of the fourth toe left foot dorsally demonstrates a dry translucent scale over an erythematous base with very small petechial type lesions in the erythematous base.  This does not appear to be tinea.  Very well could be psoriasis but more than likely a gout Tate eczema.  Vascular: Dorsalis Pedis artery and Posterior Tibial artery pedal pulses are 2/4 bilateral with immedate capillary fill time. Pedal hair growth present. No varicosities and no lower extremity edema present bilateral.   Neruologic: Grossly intact via light touch bilateral. Vibratory intact via tuning fork bilateral. Protective threshold with Semmes Wienstein monofilament intact to all pedal sites bilateral. Patellar and Achilles deep tendon reflexes 2+ bilateral. No Babinski or clonus noted bilateral.   Musculoskeletal: No gross boney  pedal deformities bilateral. No pain, crepitus, or limitation noted with foot and ankle range of motion bilateral. Muscular strength 5/5 in all groups tested bilateral.  Gait: Unassisted, Nonantalgic.    Radiographs:  None taken  Assessment & Plan:   Assessment: Due to eczema rule out psoriasis  Plan: Start him on betamethasone  twice daily for the first week tapering to once daily thereafter.  I like to follow-up with him in 6 weeks.     Young Brim T. Ramondo Dietze, DPM    [1]  Current Outpatient Medications:    betamethasone  dipropionate 0.05 % cream, Apply once a day x 1 week, then once daily - don't cover, Disp: 45 g, Rfl: 1 [2] No Known Allergies  "

## 2024-09-21 ENCOUNTER — Ambulatory Visit: Admitting: Podiatry
